# Patient Record
Sex: Female | Born: 1975 | Race: White | Hispanic: No | Marital: Married | Smoking: Never smoker
Health system: Southern US, Community
[De-identification: ages and names within clinical notes are randomized; demographics above are authoritative.]

## PROBLEM LIST (undated history)

## (undated) DIAGNOSIS — I10 Essential (primary) hypertension: Secondary | ICD-10-CM

## (undated) DIAGNOSIS — K219 Gastro-esophageal reflux disease without esophagitis: Secondary | ICD-10-CM

## (undated) DIAGNOSIS — R51 Headache: Secondary | ICD-10-CM

## (undated) HISTORY — PX: BACK SURGERY: SHX140

## (undated) HISTORY — PX: WISDOM TOOTH EXTRACTION: SHX21

## (undated) HISTORY — PX: DILATION AND CURETTAGE OF UTERUS: SHX78

---

## 2003-06-16 ENCOUNTER — Inpatient Hospital Stay (HOSPITAL_COMMUNITY): Admission: AD | Admit: 2003-06-16 | Discharge: 2003-06-16 | Payer: Self-pay | Admitting: Obstetrics and Gynecology

## 2003-06-18 ENCOUNTER — Inpatient Hospital Stay (HOSPITAL_COMMUNITY): Admission: AD | Admit: 2003-06-18 | Discharge: 2003-06-18 | Payer: Self-pay | Admitting: Obstetrics and Gynecology

## 2003-06-22 ENCOUNTER — Inpatient Hospital Stay (HOSPITAL_COMMUNITY): Admission: AD | Admit: 2003-06-22 | Discharge: 2003-06-25 | Payer: Self-pay | Admitting: Obstetrics & Gynecology

## 2003-06-26 ENCOUNTER — Encounter: Admission: RE | Admit: 2003-06-26 | Discharge: 2003-07-26 | Payer: Self-pay | Admitting: Obstetrics and Gynecology

## 2004-06-05 ENCOUNTER — Other Ambulatory Visit: Admission: RE | Admit: 2004-06-05 | Discharge: 2004-06-05 | Payer: Self-pay | Admitting: Obstetrics and Gynecology

## 2004-06-24 ENCOUNTER — Ambulatory Visit (HOSPITAL_COMMUNITY): Admission: AD | Admit: 2004-06-24 | Discharge: 2004-06-24 | Payer: Self-pay | Admitting: Obstetrics & Gynecology

## 2004-06-24 ENCOUNTER — Encounter (INDEPENDENT_AMBULATORY_CARE_PROVIDER_SITE_OTHER): Payer: Self-pay | Admitting: *Deleted

## 2004-12-25 ENCOUNTER — Ambulatory Visit (HOSPITAL_COMMUNITY): Admission: RE | Admit: 2004-12-25 | Discharge: 2004-12-25 | Payer: Self-pay | Admitting: Obstetrics & Gynecology

## 2004-12-26 ENCOUNTER — Ambulatory Visit (HOSPITAL_COMMUNITY): Admission: RE | Admit: 2004-12-26 | Discharge: 2004-12-26 | Payer: Self-pay | Admitting: Obstetrics & Gynecology

## 2004-12-26 ENCOUNTER — Encounter (INDEPENDENT_AMBULATORY_CARE_PROVIDER_SITE_OTHER): Payer: Self-pay | Admitting: Specialist

## 2007-11-08 ENCOUNTER — Inpatient Hospital Stay (HOSPITAL_COMMUNITY): Admission: AD | Admit: 2007-11-08 | Discharge: 2007-11-08 | Payer: Self-pay | Admitting: Obstetrics and Gynecology

## 2007-11-09 ENCOUNTER — Inpatient Hospital Stay (HOSPITAL_COMMUNITY): Admission: RE | Admit: 2007-11-09 | Discharge: 2007-11-11 | Payer: Self-pay | Admitting: Obstetrics & Gynecology

## 2008-11-10 HISTORY — PX: NECK SURGERY: SHX720

## 2010-07-03 ENCOUNTER — Encounter: Admission: RE | Admit: 2010-07-03 | Discharge: 2010-07-03 | Payer: Self-pay | Admitting: Orthopedic Surgery

## 2010-12-01 ENCOUNTER — Encounter: Payer: Self-pay | Admitting: Orthopedic Surgery

## 2011-03-28 NOTE — Op Note (Signed)
Jenna Ayala, Jenna Ayala                           ACCOUNT NO.:  [id number]   MEDICAL RECORD NO.:  [id number]                   PATIENT TYPE:  AMB   LOCATION:  [facility]                                 FACILITY:  [facility]   PHYSICIAN:  Ilda Mori, M.D.                DATE OF BIRTH:  [birth year]   DATE OF PROCEDURE:  06/24/2004  DATE OF DISCHARGE:                                 OPERATIVE REPORT   PREOPERATIVE DIAGNOSES:  Incomplete abortion.   POSTOPERATIVE DIAGNOSES:  Incomplete abortion.   PROCEDURE:  Dilatation and evacuation.   SURGEON:  Ilda Mori, M.D.   ANESTHESIA:  Paracervical block with IV sedation.   ESTIMATED BLOOD LOSS:  30 mL.   FINDINGS:  The cervix was dilated to greater than 1 cm. The uterus was 10-12  week size, products of conception were obtained consistent with a 10-12 week  missed abortion.   INDICATIONS FOR PROCEDURE:  This is a 35 year old, gravida 3, para 2 at [redacted]  weeks gestation who presented to the office today with vaginal bleeding.  Ultrasound evaluation revealed a nine week fetal pull with no fetal heart  tones.  In addition, the cervix was dilated and the patient was experiencing  moderate bleeding.  The findings were discussed with the patient.  Options  were also discussed and it was strongly recommended that she proceed with  dilatation evacuation before she began to hemorrhage with a full blown  incomplete abortion.  The patient agreed and the surgery was scheduled.   DESCRIPTION OF PROCEDURE:  The patient was brought to the operating room and  she was placed in the dorsal lithotomy position.  IV sedation was  administered. The perineum and vagina were prepped and draped in a sterile  fashion. The cervix was grasped with a single tooth tenaculum, 20 mL of 1%  lidocaine was infiltrated in the paracervical tissues.  The #10 suction  curette was then introduced without dilatation into the endometrial cavity.  Suction was applied and the products of  conception were evacuated. The  procedure was continued until the uterine cavity was felt to be empty.  The  procedure was then terminated and the patient left the operating room in  good condition.                                               Ilda Mori, M.D.    RK/MEDQ  D:  06/24/2004  T:  06/24/2004  Job:  [id number]

## 2011-03-28 NOTE — Op Note (Signed)
NAMEMAILA, DUKES               ACCOUNT NO.:  [id number]   MEDICAL RECORD NO.:  [id number]          PATIENT TYPE:  OUT   LOCATION:  [facility]                           FACILITY:  [facility]   PHYSICIAN:  Gerrit Friends. Aldona Bar, M.D.   DATE OF BIRTH:  06/17/1976   DATE OF PROCEDURE:  12/25/2004  DATE OF DISCHARGE:                                 OPERATIVE REPORT   PREOPERATIVE DIAGNOSES:  1.  Second trimester pregnancy loss.  2.  Blood type A positive.   POSTOPERATIVE DIAGNOSES:  1.  Second trimester pregnancy loss, pathology pending.  2.  Blood type A positive.   PROCEDURE:  Second trimester dilatation and evacuation.   SURGEON:  Gerrit Friends. Aldona Bar, M.D.   ANESTHESIA:  Intravenous conscious sedation plus paracervical block with 1%  Xylocaine without epinephrine.   HISTORY:  This 35 year old gravida 4, para 2, abortus 1 was seen in the  office yesterday for a routine antenatal visit and was supposed to be 14  weeks' gestation.  No fetal heart was heard, no fetal heart was seen on  ultrasound, and confirmation was carried out at [facility] with a  second ultrasound with again no fetal heart seen.  Diagnosis of second  trimester pregnancy loss was made and the patient was counseled accordingly.  She is now taken to the operating room for evacuation of a second trimester  pregnancy loss by suction, dilatation and evacuation.   PROCEDURE:  The patient was taken to the operating room, where after  satisfactory induction of intravenous conscious sedation, she was placed in  a modified lithotomy position in the short Allen stirrups.  She was prepped  and draped in usual fashion and the bladder was drained of clear urine with  a red rubber catheter in an in-and-out fashion.  At this time, a speculum  was placed in the vagina, cervix well-visualized and a single-tooth  tenaculum placed on the anterior lip of the cervix for uterine stability  during the procedure.  A paracervical block at this time  was carried out  using 20 mL of 1% Xylocaine without epinephrine.  Thereafter, the internal  os was dilated to a #43 Pratt dilator without difficulty and then using a  #14 suction curette, the cavity was thoroughly gently and systematically  evacuated of products of conception.  Good uterine contractility was  afforded with slowly given intravenous Pitocin.  Using a large standard  curette, the cavity was then curetted with no further products produced.  Using a #10 suction curette, the cavity was again suctioned and no further  production of products of conception was produced.  The cavity was again  curetted and noted to be clean.  The uterus was contracting well and blood  loss at this point was minimal.   At this time, procedure was felt to be complete and all instruments were  removed.  Examination was carried out with the findings consistent with a  uterus that was approximately 8-10 weeks' size and firm.  Blood loss was  minimal at this point, although blood loss during the procedure probably  approximated 100  mL.   All products of conception were sent to pathology.  Blood type was noted to  be A-positive.   The patient will be discharged to home with appropriate instructions.  Medications will include doxycycline 100 mg twice daily for a total of 5  days, Methergine 0.2 mg p.o. every 4-6 hours as needed for increased  bleeding and Motrin 600 mg every 6 hours as needed for cramping.  She will  return to the office for followup in approximately 2 weeks' time or as  needed.   CONDITION ON ARRIVAL TO RECOVERY ROOM:  Satisfactory.      RMW/MEDQ  D:  12/26/2004  T:  12/26/2004  Job:  [id number]

## 2011-08-15 LAB — CBC
HCT: 25.7 — ABNORMAL LOW
Hemoglobin: 11.3 — ABNORMAL LOW
MCHC: 33.5
MCV: 77.3 — ABNORMAL LOW
MCV: 78
Platelets: 240
Platelets: 270
RBC: 4.28
RDW: 15.4
WBC: 9.1
WBC: 9.1

## 2011-08-15 LAB — COMPREHENSIVE METABOLIC PANEL
ALT: 14
AST: 15
AST: 18
Albumin: 2.7 — ABNORMAL LOW
CO2: 20
Calcium: 9
Chloride: 105
Creatinine, Ser: 0.7
GFR calc Af Amer: >60
GFR calc Af Amer: >60
GFR calc non Af Amer: >60
GFR calc non Af Amer: >60
Sodium: 132 — ABNORMAL LOW
Total Bilirubin: 0.6

## 2011-08-15 LAB — URIC ACID: Uric Acid, Serum: 4.7

## 2012-07-27 ENCOUNTER — Other Ambulatory Visit: Payer: Self-pay | Admitting: Obstetrics & Gynecology

## 2012-10-04 ENCOUNTER — Other Ambulatory Visit: Payer: Self-pay | Admitting: Obstetrics and Gynecology

## 2012-10-09 ENCOUNTER — Encounter (HOSPITAL_COMMUNITY): Payer: Self-pay | Admitting: Pharmacist

## 2012-10-18 ENCOUNTER — Encounter (HOSPITAL_COMMUNITY): Payer: Self-pay | Admitting: *Deleted

## 2012-10-20 MED ORDER — DEXTROSE 5 % IV SOLN
3.0000 g | INTRAVENOUS | Status: DC
  Filled 2012-10-20: qty 3000

## 2012-10-21 ENCOUNTER — Ambulatory Visit (HOSPITAL_COMMUNITY): Payer: PRIVATE HEALTH INSURANCE | Admitting: Anesthesiology

## 2012-10-21 ENCOUNTER — Encounter (HOSPITAL_COMMUNITY): Payer: Self-pay | Admitting: *Deleted

## 2012-10-21 ENCOUNTER — Encounter (HOSPITAL_COMMUNITY): Payer: Self-pay | Admitting: Anesthesiology

## 2012-10-21 ENCOUNTER — Ambulatory Visit (HOSPITAL_COMMUNITY)
Admission: RE | Admit: 2012-10-21 | Discharge: 2012-10-21 | Disposition: A | Discharge status: Home / Self Care | Payer: PRIVATE HEALTH INSURANCE | Source: Ambulatory Visit | Attending: Obstetrics and Gynecology | Admitting: Obstetrics and Gynecology

## 2012-10-21 ENCOUNTER — Encounter (HOSPITAL_COMMUNITY)
Admission: RE | Disposition: A | Discharge status: Home / Self Care | Payer: Self-pay | Source: Ambulatory Visit | Attending: Obstetrics and Gynecology

## 2012-10-21 DIAGNOSIS — N920 Excessive and frequent menstruation with regular cycle: Secondary | ICD-10-CM

## 2012-10-21 DIAGNOSIS — N840 Polyp of corpus uteri: Secondary | ICD-10-CM | POA: Insufficient documentation

## 2012-10-21 HISTORY — PX: HYSTEROSCOPY WITH D & C: SHX1775

## 2012-10-21 HISTORY — DX: Headache: R51

## 2012-10-21 LAB — CBC
MCH: 25.2 pg — ABNORMAL LOW (ref 26.0–34.0)
MCV: 80.1 fL (ref 78.0–100.0)
Platelets: 273 10*3/uL (ref 150–400)
RDW: 14.9 % (ref 11.5–15.5)
WBC: 7.3 10*3/uL (ref 4.0–10.5)

## 2012-10-21 SURGERY — DILATATION AND CURETTAGE /HYSTEROSCOPY
Anesthesia: General | Site: Uterus | Wound class: Clean Contaminated

## 2012-10-21 MED ORDER — GLYCINE 1.5 % IR SOLN
Status: DC | PRN
  Administered 2012-10-21: 3000 mL

## 2012-10-21 MED ORDER — PROPOFOL 10 MG/ML IV EMUL
INTRAVENOUS | Status: AC
  Filled 2012-10-21: qty 20

## 2012-10-21 MED ORDER — KETOROLAC TROMETHAMINE 30 MG/ML IJ SOLN
INTRAMUSCULAR | Status: AC
  Filled 2012-10-21: qty 1

## 2012-10-21 MED ORDER — FENTANYL CITRATE 0.05 MG/ML IJ SOLN
INTRAMUSCULAR | Status: AC
  Filled 2012-10-21: qty 5

## 2012-10-21 MED ORDER — KETOROLAC TROMETHAMINE 30 MG/ML IJ SOLN: 15.0000 mg | Freq: Once | INTRAMUSCULAR | Status: DC | PRN

## 2012-10-21 MED ORDER — MIDAZOLAM HCL 5 MG/5ML IJ SOLN
INTRAMUSCULAR | Status: DC | PRN
  Administered 2012-10-21: 2 mg via INTRAVENOUS

## 2012-10-21 MED ORDER — LIDOCAINE HCL (CARDIAC) 20 MG/ML IV SOLN
INTRAVENOUS | Status: DC | PRN
  Administered 2012-10-21: 80 mg via INTRAVENOUS

## 2012-10-21 MED ORDER — PROPOFOL 10 MG/ML IV EMUL
INTRAVENOUS | Status: DC | PRN
  Administered 2012-10-21: 200 mg via INTRAVENOUS

## 2012-10-21 MED ORDER — FENTANYL CITRATE 0.05 MG/ML IJ SOLN: 25.0000 ug | INTRAMUSCULAR | Status: DC | PRN

## 2012-10-21 MED ORDER — LIDOCAINE HCL 1 % IJ SOLN
INTRAMUSCULAR | Status: DC | PRN
  Administered 2012-10-21: 20 mL

## 2012-10-21 MED ORDER — LIDOCAINE HCL (CARDIAC) 20 MG/ML IV SOLN
INTRAVENOUS | Status: AC
  Filled 2012-10-21: qty 5

## 2012-10-21 MED ORDER — MIDAZOLAM HCL 2 MG/2ML IJ SOLN
INTRAMUSCULAR | Status: AC
  Filled 2012-10-21: qty 2

## 2012-10-21 MED ORDER — ONDANSETRON HCL 4 MG/2ML IJ SOLN
INTRAMUSCULAR | Status: AC
  Filled 2012-10-21: qty 2

## 2012-10-21 MED ORDER — FENTANYL CITRATE 0.05 MG/ML IJ SOLN
INTRAMUSCULAR | Status: DC | PRN
  Administered 2012-10-21: 100 ug via INTRAVENOUS

## 2012-10-21 MED ORDER — ONDANSETRON HCL 4 MG/2ML IJ SOLN
INTRAMUSCULAR | Status: DC | PRN
  Administered 2012-10-21: 4 mg via INTRAVENOUS

## 2012-10-21 MED ORDER — LACTATED RINGERS IV SOLN
INTRAVENOUS | Status: DC
  Administered 2012-10-21 (×2): via INTRAVENOUS

## 2012-10-21 MED ORDER — KETOROLAC TROMETHAMINE 30 MG/ML IJ SOLN
INTRAMUSCULAR | Status: DC | PRN
  Administered 2012-10-21: 30 mg via INTRAVENOUS
  Administered 2012-10-21: 30 mg via INTRAMUSCULAR

## 2012-10-21 MED ORDER — CLINDAMYCIN PHOSPHATE 300 MG/2ML IJ SOLN
900.0000 mg | Freq: Once | INTRAMUSCULAR | Status: AC
  Administered 2012-10-21: 900 mg via INTRAVENOUS
  Filled 2012-10-21: qty 6

## 2012-10-21 SURGICAL SUPPLY — 15 items
CANISTER SUCTION 2500CC (MISCELLANEOUS) ×2 IMPLANT
CATH ROBINSON RED A/P 16FR (CATHETERS) ×2 IMPLANT
CLOTH BEACON ORANGE TIMEOUT ST (SAFETY) ×2 IMPLANT
CONTAINER PREFILL 10% NBF 60ML (FORM) ×4 IMPLANT
DRESSING TELFA 8X3 (GAUZE/BANDAGES/DRESSINGS) ×2 IMPLANT
ELECT REM PT RETURN 9FT ADLT (ELECTROSURGICAL) ×2
ELECTRODE REM PT RTRN 9FT ADLT (ELECTROSURGICAL) ×1 IMPLANT
GLOVE ECLIPSE 7.0 STRL STRAW (GLOVE) ×4 IMPLANT
GOWN PREVENTION PLUS XLARGE (GOWN DISPOSABLE) ×2 IMPLANT
GOWN STRL REIN XL XLG (GOWN DISPOSABLE) ×4 IMPLANT
LOOP ANGLED CUTTING 22FR (CUTTING LOOP) IMPLANT
PACK HYSTEROSCOPY LF (CUSTOM PROCEDURE TRAY) ×2 IMPLANT
PAD OB MATERNITY 4.3X12.25 (PERSONAL CARE ITEMS) ×2 IMPLANT
TOWEL OR 17X24 6PK STRL BLUE (TOWEL DISPOSABLE) ×4 IMPLANT
WATER STERILE IRR 1000ML POUR (IV SOLUTION) ×2 IMPLANT

## 2012-10-21 NOTE — Transfer of Care (Signed)
Immediate Anesthesia Transfer of Care Note  Patient: Jenna Ayala  Procedure(s) Performed: Procedure(s) (LRB) with comments: DILATATION AND CURETTAGE /HYSTEROSCOPY (N/A)  Patient Location: PACU  Anesthesia Type:General  Level of Consciousness: awake, alert  and oriented  Airway & Oxygen Therapy: Patient Spontanous Breathing and Patient connected to nasal cannula oxygen  Post-op Assessment: Report given to PACU RN and Post -op Vital signs reviewed and stable  Post vital signs: stable  Complications: No apparent anesthesia complications

## 2012-10-21 NOTE — H&P (Signed)
Pt is a 36 yr old white female, who presents for hysteroscopy, and removal of endometrial polyp. She has had an endometrial polyp which was benign. PE: Overwt white female in NAD        HEENT-wnl        Abd- soft, nt        Pelvic-wnl IMP/ Menorrhagia         Endometrial polyp PLAN/ hysteroscopy            Removal of polyp

## 2012-10-21 NOTE — Anesthesia Preprocedure Evaluation (Addendum)
Anesthesia Evaluation  Patient identified by MRN, date of birth, ID band Patient awake    Reviewed: Allergy & Precautions, H&P , NPO status , Patient's Chart, lab work & pertinent test results, reviewed documented beta blocker date and time   History of Anesthesia Complications Negative for: history of anesthetic complications  Airway Mallampati: II TM Distance: >3 FB Neck ROM: full    Dental  (+) Teeth Intact   Pulmonary neg pulmonary ROS,  breath sounds clear to auscultation  Pulmonary exam normal       Cardiovascular Exercise Tolerance: Good negative cardio ROS  Rhythm:regular Rate:Normal     Neuro/Psych  Headaches (migraines - 3x/month), negative psych ROS   GI/Hepatic negative GI ROS, Neg liver ROS,   Endo/Other  Morbid obesity  Renal/GU negative Renal ROS  Female GU complaint     Musculoskeletal   Abdominal   Peds  Hematology negative hematology ROS (+)   Anesthesia Other Findings   Reproductive/Obstetrics negative OB ROS                          Anesthesia Physical Anesthesia Plan  ASA: III  Anesthesia Plan: General LMA   Post-op Pain Management:    Induction:   Airway Management Planned:   Additional Equipment:   Intra-op Plan:   Post-operative Plan:   Informed Consent: I have reviewed the patients History and Physical, chart, labs and discussed the procedure including the risks, benefits and alternatives for the proposed anesthesia with the patient or authorized representative who has indicated his/her understanding and acceptance.   Dental Advisory Given  Plan Discussed with: CRNA and Surgeon  Anesthesia Plan Comments:         Anesthesia Quick Evaluation

## 2012-10-21 NOTE — Anesthesia Postprocedure Evaluation (Signed)
Anesthesia Post Note  Patient: Jenna Ayala  Procedure(s) Performed: Procedure(s) (LRB): DILATATION AND CURETTAGE /HYSTEROSCOPY (N/A)  Anesthesia type: General  Patient location: PACU  Post pain: Pain level controlled  Post assessment: Post-op Vital signs reviewed  Last Vitals:  Filed Vitals:   10/21/12 1215  BP: 123/72  Pulse: 68  Temp:   Resp: 16    Post vital signs: Reviewed  Level of consciousness: sedated  Complications: No apparent anesthesia complications

## 2012-10-22 ENCOUNTER — Encounter (HOSPITAL_COMMUNITY): Payer: Self-pay | Admitting: Obstetrics and Gynecology

## 2012-10-22 NOTE — Op Note (Signed)
Jenna Ayala, Jenna Ayala               ACCOUNT NO.:  [id number]  MEDICAL RECORD NO.:  [id number]  LOCATION:  [facility]                          FACILITY:  [facility]  PHYSICIAN:  Malva Limes, M.D.    DATE OF BIRTH:  28-Sep-1976  DATE OF PROCEDURE:  10/21/2012 DATE OF DISCHARGE:  10/21/2012                              OPERATIVE REPORT   PREOPERATIVE DIAGNOSES: 1. Menorrhagia. 2. Suspected endometrial polyp.  POSTOPERATIVE DIAGNOSES: 1. Menorrhagia. 2. Suspected endometrial polyp.  PROCEDURES: 1. Hysteroscopy. 2. Dilation and curettage.  SURGEON:  Malva Limes, M.D.  ANESTHESIA:  General and paracervical block.  DRAINS:  Red rubber catheter bladder.  SPECIMENS:  Endometrial and endocervical curettings sent to Pathology.  ANTIBIOTICS:  Cleocin 900 mg IV x1.  PROCEDURE:  The patient was taken to the operating room where she was placed in dorsal supine position.  A general anesthetic was administered without difficulty.  She was then placed in the dorsal lithotomy position.  She was prepped and draped in the usual fashion for this procedure.  An exam under anesthesia revealed a normal size and shape to her uterus.  There was no adnexal masses.  The patient did have first- degree prolapse and a cystocele and rectocele.  A single-tooth tenaculum was applied to the anterior cervical lip.  A 20 mL of 1% lidocaine was used for paracervical block.  The cervix was dilated to a 27-French. The hysteroscope was advanced through the endocervical canal.  On entering the uterine cavity, the patient had a small polyp on the anterior right midbody.  Both ostia were visualized.  There was no evidence of any fibroids.  At this point, the hysteroscope was removed and sharp curettage was performed.  The hysteroscope was placed back into the uterine cavity.  It appeared that the polyp was removed and adequate curettage was completed.  This concluded the procedure.  She was taken to the recovery room in  stable condition. Instrument and lap count were correct x2.  She will be discharged to home.  She will be sent home with Advil to take p.r.n.  She will follow up in the office in 4 weeks.          ______________________________ Malva Limes, M.D.     MA/MEDQ  D:  10/21/2012  T:  10/22/2012  Job:  [id number]

## 2013-08-22 ENCOUNTER — Other Ambulatory Visit: Payer: Self-pay | Admitting: *Deleted

## 2013-08-22 DIAGNOSIS — R209 Unspecified disturbances of skin sensation: Secondary | ICD-10-CM

## 2013-08-22 DIAGNOSIS — M545 Low back pain: Secondary | ICD-10-CM

## 2013-08-27 ENCOUNTER — Other Ambulatory Visit: Payer: PRIVATE HEALTH INSURANCE

## 2013-08-28 ENCOUNTER — Other Ambulatory Visit: Payer: PRIVATE HEALTH INSURANCE

## 2013-09-07 ENCOUNTER — Ambulatory Visit
Admission: RE | Admit: 2013-09-07 | Discharge: 2013-09-07 | Disposition: A | Discharge status: Home / Self Care | Payer: PRIVATE HEALTH INSURANCE | Source: Ambulatory Visit | Attending: *Deleted | Admitting: *Deleted

## 2013-09-07 DIAGNOSIS — M545 Low back pain: Secondary | ICD-10-CM

## 2013-09-07 DIAGNOSIS — R209 Unspecified disturbances of skin sensation: Secondary | ICD-10-CM

## 2013-09-11 ENCOUNTER — Other Ambulatory Visit: Payer: PRIVATE HEALTH INSURANCE

## 2020-07-25 ENCOUNTER — Ambulatory Visit: Payer: PRIVATE HEALTH INSURANCE | Admitting: Family Medicine

## 2020-07-30 ENCOUNTER — Ambulatory Visit (INDEPENDENT_AMBULATORY_CARE_PROVIDER_SITE_OTHER): Payer: Managed Care, Other (non HMO) | Admitting: Family Medicine

## 2020-07-30 ENCOUNTER — Encounter: Payer: Self-pay | Admitting: Family Medicine

## 2020-07-30 ENCOUNTER — Other Ambulatory Visit: Payer: Self-pay

## 2020-07-30 VITALS — BP 130/88 | HR 87 | Temp 98.3°F | Ht 67.0 in | Wt 307.0 lb

## 2020-07-30 DIAGNOSIS — Z0000 Encounter for general adult medical examination without abnormal findings: Secondary | ICD-10-CM

## 2020-07-30 DIAGNOSIS — Z8739 Personal history of other diseases of the musculoskeletal system and connective tissue: Secondary | ICD-10-CM | POA: Insufficient documentation

## 2020-07-30 DIAGNOSIS — Z9889 Other specified postprocedural states: Secondary | ICD-10-CM | POA: Insufficient documentation

## 2020-07-30 DIAGNOSIS — E559 Vitamin D deficiency, unspecified: Secondary | ICD-10-CM | POA: Diagnosis not present

## 2020-07-30 DIAGNOSIS — Z1159 Encounter for screening for other viral diseases: Secondary | ICD-10-CM | POA: Diagnosis not present

## 2020-07-30 DIAGNOSIS — Z114 Encounter for screening for human immunodeficiency virus [HIV]: Secondary | ICD-10-CM

## 2020-07-30 NOTE — Patient Instructions (Addendum)
-look for tetanus shot. Need every 10 years.  -colon cancer screening at age 45 years. Let me know if you would like to do colonoscopy or cologuard (send off kit that is good for 3 years. Checks DNA of stool for risk of cancer)  -need mammogram. Highly recommend.   Preventive Care 91-74 Years Old, Female Preventive care refers to visits with your health care provider and lifestyle choices that can promote health and wellness. This includes:  A yearly physical exam. This may also be called an annual well check.  Regular dental visits and eye exams.  Immunizations.  Screening for certain conditions.  Healthy lifestyle choices, such as eating a healthy diet, getting regular exercise, not using drugs or products that contain nicotine and tobacco, and limiting alcohol use. What can I expect for my preventive care visit? Physical exam Your health care provider will check your:  Height and weight. This may be used to calculate body mass index (BMI), which tells if you are at a healthy weight.  Heart rate and blood pressure.  Skin for abnormal spots. Counseling Your health care provider may ask you questions about your:  Alcohol, tobacco, and drug use.  Emotional well-being.  Home and relationship well-being.  Sexual activity.  Eating habits.  Work and work Statistician.  Method of birth control.  Menstrual cycle.  Pregnancy history. What immunizations do I need?  Influenza (flu) vaccine  This is recommended every year. Tetanus, diphtheria, and pertussis (Tdap) vaccine  You may need a Td booster every 10 years. Varicella (chickenpox) vaccine  You may need this if you have not been vaccinated. Zoster (shingles) vaccine  You may need this after age 40. Measles, mumps, and rubella (MMR) vaccine  You may need at least one dose of MMR if you were born in 1957 or later. You may also need a second dose. Pneumococcal conjugate (PCV13) vaccine  You may need this if you  have certain conditions and were not previously vaccinated. Pneumococcal polysaccharide (PPSV23) vaccine  You may need one or two doses if you smoke cigarettes or if you have certain conditions. Meningococcal conjugate (MenACWY) vaccine  You may need this if you have certain conditions. Hepatitis A vaccine  You may need this if you have certain conditions or if you travel or work in places where you may be exposed to hepatitis A. Hepatitis B vaccine  You may need this if you have certain conditions or if you travel or work in places where you may be exposed to hepatitis B. Haemophilus influenzae type b (Hib) vaccine  You may need this if you have certain conditions. Human papillomavirus (HPV) vaccine  If recommended by your health care provider, you may need three doses over 6 months. You may receive vaccines as individual doses or as more than one vaccine together in one shot (combination vaccines). Talk with your health care provider about the risks and benefits of combination vaccines. What tests do I need? Blood tests  Lipid and cholesterol levels. These may be checked every 5 years, or more frequently if you are over 59 years old.  Hepatitis C test.  Hepatitis B test. Screening  Lung cancer screening. You may have this screening every year starting at age 71 if you have a 30-pack-year history of smoking and currently smoke or have quit within the past 15 years.  Colorectal cancer screening. All adults should have this screening starting at age 36 and continuing until age 50. Your health care provider may recommend  screening at age 15 if you are at increased risk. You will have tests every 1-10 years, depending on your results and the type of screening test.  Diabetes screening. This is done by checking your blood sugar (glucose) after you have not eaten for a while (fasting). You may have this done every 1-3 years.  Mammogram. This may be done every 1-2 years. Talk with your  health care provider about when you should start having regular mammograms. This may depend on whether you have a family history of breast cancer.  BRCA-related cancer screening. This may be done if you have a family history of breast, ovarian, tubal, or peritoneal cancers.  Pelvic exam and Pap test. This may be done every 3 years starting at age 81. Starting at age 64, this may be done every 5 years if you have a Pap test in combination with an HPV test. Other tests  Sexually transmitted disease (STD) testing.  Bone density scan. This is done to screen for osteoporosis. You may have this scan if you are at high risk for osteoporosis. Follow these instructions at home: Eating and drinking  Eat a diet that includes fresh fruits and vegetables, whole grains, lean protein, and low-fat dairy.  Take vitamin and mineral supplements as recommended by your health care provider.  Do not drink alcohol if: ? Your health care provider tells you not to drink. ? You are pregnant, may be pregnant, or are planning to become pregnant.  If you drink alcohol: ? Limit how much you have to 0-1 drink a day. ? Be aware of how much alcohol is in your drink. In the [place] one drink equals one 12 oz bottle of beer (355 mL), one 5 oz glass of wine (148 mL), or one 1 oz glass of hard liquor (44 mL). Lifestyle  Take daily care of your teeth and gums.  Stay active. Exercise for at least 30 minutes on 5 or more days each week.  Do not use any products that contain nicotine or tobacco, such as cigarettes, e-cigarettes, and chewing tobacco. If you need help quitting, ask your health care provider.  If you are sexually active, practice safe sex. Use a condom or other form of birth control (contraception) in order to prevent pregnancy and STIs (sexually transmitted infections).  If told by your health care provider, take low-dose aspirin daily starting at age 42. What's next?  Visit your health care provider once  a year for a well check visit.  Ask your health care provider how often you should have your eyes and teeth checked.  Stay up to date on all vaccines. This information is not intended to replace advice given to you by your health care provider. Make sure you discuss any questions you have with your health care provider. Document Revised: 07/08/2018 Document Reviewed: 07/08/2018 Elsevier Patient Education  2020 [employer].  See below for covid prophylaxis.   1) if you test positive, I need to know right away so I can set you up with infusion. We have here at [facility] I just have to call and get you set up with them.    Also while covid is rampant you can take vitamins for prevention then if you get covid, I increase the vitamins and start ivermectin and possibly other steroid/inhaled steroids if needed. Below is prevention protocol.   1) vitamin D3 2000IU/day 2) vitamin C: 500-1040m twice a day 3) quercetin: 501mdaily 4) melatonin 73m74mefore bedtime (may cause drowsiness)  5) zinc 30-21m/day  Gargle mouthwash like scope/act/crest twice a day.   Call me right away if you get covid/set up telehealth appointment so we can discuss treatment.

## 2020-07-30 NOTE — Progress Notes (Signed)
Patient: Jenna Ayala MRN: [id number] DOB: 10-31-1976 PCP: Orma Flaming, MD     Subjective:  Chief Complaint  Patient presents with   Establish Care    HPI: The patient is a 44 y.o. female who presents today for annual exam. She denies any changes to past medical history. There have been no recent hospitalizations. They are not following a well balanced diet and exercise plan. Weight has been stable. No complaints today.   No family history of breast or colon cancer.   There is no immunization history on file for this patient.  Colonoscopy: age 89 years.  Mammogram: never had this done.  Pap smear: 2018-normal followed at Dr. Stann Mainland   Review of Systems  Constitutional: Negative for chills, fatigue and fever.  HENT: Negative for dental problem, ear pain, hearing loss and trouble swallowing.   Eyes: Negative for visual disturbance.  Respiratory: Negative for cough, chest tightness and shortness of breath.   Cardiovascular: Negative for chest pain, palpitations and leg swelling.  Gastrointestinal: Negative for abdominal pain, blood in stool, diarrhea and nausea.  Endocrine: Negative for cold intolerance, polydipsia, polyphagia and polyuria.  Genitourinary: Negative for dysuria and hematuria.  Musculoskeletal: Positive for arthralgias and back pain.  Skin: Negative for rash.  Neurological: Negative for dizziness and headaches.  Psychiatric/Behavioral: Negative for dysphoric mood and sleep disturbance. The patient is not nervous/anxious.     Allergies Patient is allergic to penicillins.  Past Medical History Patient  has a past medical history of Headache(784.0) and SVD (spontaneous vaginal delivery).  Surgical History Patient  has a past surgical history that includes Neck surgery (2010); Dilation and curettage of uterus; Wisdom tooth extraction; and Hysteroscopy with D & C (10/21/2012).  Family History Pateint's family history is not on file.  Social History Patient   reports that she has never smoked. She has never used smokeless tobacco. She reports that she does not drink alcohol and does not use drugs.    Objective: Vitals:   07/30/20 1427  BP: 130/88  Pulse: 87  Temp: 98.3 F (36.8 C)  SpO2: 98%  Weight: (!) 307 lb (139.3 kg)  Height: 5' 7"  (1.702 m)    Body mass index is 48.08 kg/m.  Physical Exam Vitals reviewed.  Constitutional:      Appearance: Normal appearance. She is well-developed. She is obese.  HENT:     Head: Normocephalic and atraumatic.     Right Ear: Tympanic membrane, ear canal and external ear normal.     Left Ear: Tympanic membrane, ear canal and external ear normal.     Nose: Nose normal.     Mouth/Throat:     Mouth: Mucous membranes are moist.  Eyes:     Conjunctiva/sclera: Conjunctivae normal.     Pupils: Pupils are equal, round, and reactive to light.  Neck:     Thyroid: No thyromegaly.  Cardiovascular:     Rate and Rhythm: Normal rate and regular rhythm.     Pulses: Normal pulses.     Heart sounds: Normal heart sounds. No murmur heard.   Pulmonary:     Effort: Pulmonary effort is normal.     Breath sounds: Normal breath sounds.  Abdominal:     General: Bowel sounds are normal. There is no distension.     Palpations: Abdomen is soft.     Tenderness: There is no abdominal tenderness.  Musculoskeletal:     Cervical back: Normal range of motion and neck supple.  Lymphadenopathy:     Cervical:  No cervical adenopathy.  Skin:    General: Skin is warm and dry.     Capillary Refill: Capillary refill takes less than 2 seconds.     Findings: No rash.  Neurological:     General: No focal deficit present.     Mental Status: She is alert and oriented to person, place, and time.     Cranial Nerves: No cranial nerve deficit.     Coordination: Coordination normal.     Deep Tendon Reflexes: Reflexes normal.  Psychiatric:        Mood and Affect: Mood normal.        Behavior: Behavior normal.      Office Visit  from 07/30/2020 in [facility] [place]  PHQ-2 Total Score 0         Assessment/plan: 1. Annual physical exam Routine labs today. HM reviewed. Asked if she could look for her tdap. Pap smear utd with gyn. Encouraged weight loss and exercise. She knows what she needs to do. Has lost 80 pounds in the past. Will see her back in one year or as needed.  Patient counseling [x]    Nutrition: Stressed importance of moderation in sodium/caffeine intake, saturated fat and cholesterol, caloric balance, sufficient intake of fresh fruits, vegetables, fiber, calcium, iron, and 1 mg of folate supplement per day (for females capable of pregnancy).  [x]    Stressed the importance of regular exercise.   []    Substance Abuse: Discussed cessation/primary prevention of tobacco, alcohol, or other drug use; driving or other dangerous activities under the influence; availability of treatment for abuse.   [x]    Injury prevention: Discussed safety belts, safety helmets, smoke detector, smoking near bedding or upholstery.   [x]    Sexuality: Discussed sexually transmitted diseases, partner selection, use of condoms, avoidance of unintended pregnancy  and contraceptive alternatives.  [x]    Dental health: Discussed importance of regular tooth brushing, flossing, and dental visits.  [x]    Health maintenance and immunizations reviewed. Please refer to Health maintenance section.    - CBC with Differential/Platelet; Future - Lipid panel; Future - TSH; Future - COMPLETE METABOLIC PANEL WITH GFR; Future - CBC with Differential/Platelet - Lipid panel - TSH - COMPLETE METABOLIC PANEL WITH GFR  2. Vitamin D deficiency  - VITAMIN D 25 Hydroxy (Vit-D Deficiency, Fractures); Future - VITAMIN D 25 Hydroxy (Vit-D Deficiency, Fractures)  3. Encounter for screening for HIV  - HIV Antibody (routine testing w rflx); Future - HIV Antibody (routine testing w rflx)  4. Encounter for hepatitis C screening test for  low risk patient  - Hepatitis C antibody; Future - Hepatitis C antibody     This visit occurred during the SARS-CoV-2 public health emergency.  Safety protocols were in place, including screening questions prior to the visit, additional usage of staff PPE, and extensive cleaning of exam room while observing appropriate contact time as indicated for disinfecting solutions.     Return in about 1 year (around 07/30/2021) for annual .     Orma Flaming, MD [place]  07/30/2020

## 2020-07-31 LAB — LIPID PANEL
Cholesterol: 214 mg/dL — ABNORMAL HIGH (ref <200)
HDL: 50 mg/dL (ref >=50)
LDL Cholesterol (Calc): 141 mg/dL (calc) — ABNORMAL HIGH
Non-HDL Cholesterol (Calc): 164 mg/dL (calc) — ABNORMAL HIGH (ref <130)
Total CHOL/HDL Ratio: 4.3 (calc) (ref <5.0)
Triglycerides: 110 mg/dL (ref <150)

## 2020-07-31 LAB — COMPLETE METABOLIC PANEL WITH GFR
AG Ratio: 1.2 (calc) (ref 1.0–2.5)
ALT: 17 U/L (ref 6–29)
AST: 15 U/L (ref 10–35)
Albumin: 4.3 g/dL (ref 3.6–5.1)
Alkaline phosphatase (APISO): 75 U/L (ref 31–125)
BUN: 10 mg/dL (ref 7–25)
CO2: 23 mmol/L (ref 20–32)
Calcium: 9.9 mg/dL (ref 8.6–10.2)
Chloride: 105 mmol/L (ref 98–110)
Creat: 0.75 mg/dL (ref 0.50–1.10)
GFR, Est African American: 112 mL/min/{1.73_m2} (ref >=60)
GFR, Est Non African American: 96 mL/min/{1.73_m2} (ref >=60)
Globulin: 3.6 g/dL (calc) (ref 1.9–3.7)
Glucose, Bld: 107 mg/dL — ABNORMAL HIGH (ref 65–99)
Potassium: 4.1 mmol/L (ref 3.5–5.3)
Sodium: 137 mmol/L (ref 135–146)
Total Bilirubin: 0.4 mg/dL (ref 0.2–1.2)
Total Protein: 7.9 g/dL (ref 6.1–8.1)

## 2020-07-31 LAB — CBC WITH DIFFERENTIAL/PLATELET
Absolute Monocytes: 489 cells/uL (ref 200–950)
Basophils Absolute: 56 cells/uL (ref 0–200)
Basophils Relative: 0.6 %
Eosinophils Absolute: 179 cells/uL (ref 15–500)
Eosinophils Relative: 1.9 %
HCT: 40.2 % (ref 35.0–45.0)
Hemoglobin: 12.8 g/dL (ref 11.7–15.5)
Lymphs Abs: 1598 cells/uL (ref 850–3900)
MCH: 26.3 pg — ABNORMAL LOW (ref 27.0–33.0)
MCHC: 31.8 g/dL — ABNORMAL LOW (ref 32.0–36.0)
MCV: 82.5 fL (ref 80.0–100.0)
MPV: 9.9 fL (ref 7.5–12.5)
Monocytes Relative: 5.2 %
Neutro Abs: 7078 cells/uL (ref 1500–7800)
Neutrophils Relative %: 75.3 %
Platelets: 328 10*3/uL (ref 140–400)
RBC: 4.87 10*6/uL (ref 3.80–5.10)
RDW: 14.6 % (ref 11.0–15.0)
Total Lymphocyte: 17 %
WBC: 9.4 10*3/uL (ref 3.8–10.8)

## 2020-07-31 LAB — TSH: TSH: 2.06 mIU/L

## 2020-07-31 LAB — HIV ANTIBODY (ROUTINE TESTING W REFLEX): HIV 1&2 Ab, 4th Generation: NONREACTIVE

## 2020-07-31 LAB — HEPATITIS C ANTIBODY
Hepatitis C Ab: NONREACTIVE
SIGNAL TO CUT-OFF: 0.02 (ref <1.00)

## 2020-07-31 LAB — VITAMIN D 25 HYDROXY (VIT D DEFICIENCY, FRACTURES): Vit D, 25-Hydroxy: 17 ng/mL — ABNORMAL LOW (ref 30–100)

## 2020-08-01 ENCOUNTER — Other Ambulatory Visit: Payer: Self-pay | Admitting: Family Medicine

## 2020-08-01 DIAGNOSIS — E559 Vitamin D deficiency, unspecified: Secondary | ICD-10-CM | POA: Insufficient documentation

## 2020-08-01 DIAGNOSIS — E782 Mixed hyperlipidemia: Secondary | ICD-10-CM | POA: Insufficient documentation

## 2020-08-01 MED ORDER — VITAMIN D (ERGOCALCIFEROL) 1.25 MG (50000 UNIT) PO CAPS: ORAL_CAPSULE | ORAL | 0 refills | Status: DC

## 2020-08-09 ENCOUNTER — Telehealth: Payer: Self-pay

## 2020-08-09 NOTE — Telephone Encounter (Signed)
I was able to speak with the pt to recommend Covid Vaccine resources through [facility] and CDC website.

## 2020-08-09 NOTE — Telephone Encounter (Signed)
Pt would like a callback in regards to questions about the covid vaccination.

## 2020-08-14 ENCOUNTER — Telehealth: Payer: Self-pay

## 2020-08-14 NOTE — Telephone Encounter (Signed)
Pt called back with more questions about the vaccine. Pt states she would rather speak with Dr. Rogers Blocker about these questions. They are related to allergic reaction information. Please advise.

## 2020-08-14 NOTE — Telephone Encounter (Signed)
Will pt need virtual to discuss/address questions with you?

## 2020-08-16 NOTE — Telephone Encounter (Signed)
Melitta, Let her know I will call her tomorrow!  Orma Flaming, MD [place]  e

## 2020-08-16 NOTE — Telephone Encounter (Signed)
Called pt to give message below. Pt's concerns are Angioedema and would like to know if she needs to have the vaccine?

## 2020-08-20 NOTE — Telephone Encounter (Signed)
Called and answered all questions regarding vaccines concerns. She is setting up appointment.  Dr. Rogers Blocker

## 2021-03-11 ENCOUNTER — Other Ambulatory Visit: Payer: Self-pay | Admitting: Family Medicine

## 2021-03-11 MED ORDER — ACYCLOVIR 400 MG PO TABS: 400.0000 mg | ORAL_TABLET | Freq: Three times a day (TID) | ORAL | 2 refills | Status: AC

## 2021-08-01 ENCOUNTER — Encounter: Payer: Managed Care, Other (non HMO) | Admitting: Family Medicine

## 2021-08-05 ENCOUNTER — Encounter: Payer: Managed Care, Other (non HMO) | Admitting: Family Medicine

## 2021-10-07 ENCOUNTER — Encounter: Payer: Self-pay | Admitting: Family Medicine

## 2021-10-07 ENCOUNTER — Other Ambulatory Visit: Payer: Self-pay

## 2021-10-07 ENCOUNTER — Ambulatory Visit: Payer: Managed Care, Other (non HMO) | Admitting: Family Medicine

## 2021-10-07 VITALS — BP 126/82 | HR 79 | Temp 97.5°F | Ht 67.0 in | Wt 300.2 lb

## 2021-10-07 DIAGNOSIS — E559 Vitamin D deficiency, unspecified: Secondary | ICD-10-CM

## 2021-10-07 DIAGNOSIS — Z0000 Encounter for general adult medical examination without abnormal findings: Secondary | ICD-10-CM | POA: Diagnosis not present

## 2021-10-07 DIAGNOSIS — L309 Dermatitis, unspecified: Secondary | ICD-10-CM

## 2021-10-07 DIAGNOSIS — Z1212 Encounter for screening for malignant neoplasm of rectum: Secondary | ICD-10-CM

## 2021-10-07 DIAGNOSIS — Z1211 Encounter for screening for malignant neoplasm of colon: Secondary | ICD-10-CM

## 2021-10-07 DIAGNOSIS — Z1231 Encounter for screening mammogram for malignant neoplasm of breast: Secondary | ICD-10-CM | POA: Diagnosis not present

## 2021-10-07 DIAGNOSIS — J4520 Mild intermittent asthma, uncomplicated: Secondary | ICD-10-CM

## 2021-10-07 DIAGNOSIS — G8929 Other chronic pain: Secondary | ICD-10-CM

## 2021-10-07 DIAGNOSIS — G43009 Migraine without aura, not intractable, without status migrainosus: Secondary | ICD-10-CM

## 2021-10-07 DIAGNOSIS — M545 Low back pain, unspecified: Secondary | ICD-10-CM

## 2021-10-07 HISTORY — DX: Migraine without aura, not intractable, without status migrainosus: G43.009

## 2021-10-07 HISTORY — DX: Low back pain, unspecified: M54.50

## 2021-10-07 HISTORY — DX: Dermatitis, unspecified: L30.9

## 2021-10-07 HISTORY — DX: Mild intermittent asthma, uncomplicated: J45.20

## 2021-10-07 MED ORDER — TRIAMCINOLONE ACETONIDE 0.1 % EX CREA: 1.0000 "application " | TOPICAL_CREAM | Freq: Two times a day (BID) | CUTANEOUS | 1 refills | Status: AC

## 2021-10-07 NOTE — Progress Notes (Signed)
Subjective  Chief Complaint  Patient presents with   Transitions Of Care   Annual Exam    Not fasting    HPI: Jenna Ayala is a 45 y.o. female who presents to [place] at [place] today for a Female Wellness Visit. She also has the concerns and/or needs as listed above in the chief complaint. These will be addressed in addition to the Health Maintenance Visit.   Wellness Visit: annual visit with health maintenance review and exam without Pap  HM: pt overdue for pap: sees Dr. Stann Mainland. Overdue for mammogram. Eligible for CRC screen; avg risk. Declines imms at this time: flu and shingrix Chronic disease f/u and/or acute problem visit: (deemed necessary to be done in addition to the wellness visit): Married 45 year old female with 3 children ages ranging 14-26.  All living in the home.  She works full-time from home.  Overall healthy but we did discuss each chronic medical problems as outlined below.  I updated her problem list, family history and social history. She is past history of migraine headaches.  She has been treated with triptans in the past but has failed.  Uses ibuprofen.  Gets headache only intermittently.  No red flag symptoms.  Has not been followed by neurology. Chronic low back pain: I reviewed notes from her past neurosurgical encounters.  She has had 2 spinal surgeries: Lumbar and cervical.  Still has intermittent flares of lumbago.  She uses intermittent muscle relaxers NSAIDs and her prior PCP gave her hydrocodone to use.  She rarely uses this.  Her last prescription was written over a year ago.  She does not follow with neurosurgery now.  Rare neck symptoms. Has chronically dry skin but worse in the winter and also has some eczematous patches on her dorsal hands.  She uses triamcinolone cream as needed.  She requests refill. Obesity: struggles life long.  Assessment  1. Annual physical exam   2. Vitamin D deficiency   3. Morbid obesity ([place])   4.  Encounter for screening mammogram for breast cancer   5. Screening for colorectal cancer   6. Migraine without aura and without status migrainosus, not intractable   7. Mild intermittent asthma without complication   8. Chronic midline low back pain without sciatica   9. Eczema, unspecified type      Plan  Female Wellness Visit: Age appropriate Health Maintenance and Prevention measures were discussed with patient. Included topics are cancer screening recommendations, ways to keep healthy (see AVS) including dietary and exercise recommendations, regular eye and dental care, use of seat belts, and avoidance of moderate alcohol use and tobacco use. Crc options discussed; cologuard ordered. Mammo ordered. Pt to scheduel with gyn for female wellness and pap BMI: discussed patient's BMI and encouraged positive lifestyle modifications to help get to or maintain a target BMI. HM needs and immunizations were addressed and ordered. See below for orders. See HM and immunization section for updates. Declines.  Routine labs and screening tests ordered including cmp, cbc and lipids where appropriate. Discussed recommendations regarding Vit D and calcium supplementation (see AVS)  Chronic disease management visit and/or acute problem visit: Migraines: advil prn Eczema: TAC cream.  Low back pain: will monitor. Mm relaxer and rare norco discussed.  Ashtma: MI, albuterol rarely.  Recheck vit D and labs.   Follow up: Return in about 6 months (around 04/06/2022) for blood pressure recheck.  Orders Placed This Encounter  Procedures   MM DIGITAL SCREENING BILATERAL  CBC with Differential/Platelet   Comprehensive metabolic panel   Lipid panel   VITAMIN D 25 Hydroxy (Vit-D Deficiency, Fractures)   Hemoglobin A1c   TSH   Cologuard   Meds ordered this encounter  Medications   triamcinolone cream (KENALOG) 0.1 %    Sig: Apply 1 application topically 2 (two) times daily. For 2 weeks, then as needed     Dispense:  80 g    Refill:  1       Body mass index is 47.02 kg/m. Wt Readings from Last 3 Encounters:  10/07/21 (!) 300 lb 3.2 oz (136.2 kg)  07/30/20 (!) 307 lb (139.3 kg)  10/18/12 268 lb (121.6 kg)     Patient Active Problem List   Diagnosis Date Noted   Migraine without aura and without status migrainosus, not intractable 10/07/2021    Ibuprofen; has failed triptans    Asthma, mild intermittent 10/07/2021   Lumbago 10/07/2021    Intermittent flares    Eczema 10/07/2021    Hand, TAC cream prn    Vitamin D deficiency 08/01/2020    repleted 07/2020    S/P discectomy for herniated nucleus pulposus 07/30/2020    2014. Hemilaminectomy and discectomy L5-S1 at [facility] neurosurgery     Health Maintenance  Topic Date Due   TETANUS/TDAP  Never done   PAP SMEAR-Modifier  01/09/2020   COLONOSCOPY (Pts 45-105yr Insurance coverage will need to be confirmed)  Never done   INFLUENZA VACCINE  02/07/2022 (Originally 06/10/2021)   Hepatitis C Screening  Completed   HIV Screening  Completed   Pneumococcal Vaccine [id number] old  Aged Out   HPV VACCINES  Aged Out   COVID-19 Vaccine  Discontinued    There is no immunization history on file for this patient. We updated and reviewed the patient's past history in detail and it is documented below. Allergies: Patient is allergic to penicillins. Past Medical History Patient  has a past medical history of Asthma, mild intermittent (10/07/2021), Eczema (10/07/2021), Headache(784.0), Lumbago (10/07/2021), Migraine without aura and without status migrainosus, not intractable (10/07/2021), and SVD (spontaneous vaginal delivery). Past Surgical History Patient  has a past surgical history that includes Neck surgery (2010); Dilation and curettage of uterus; Wisdom tooth extraction; and Hysteroscopy with D & C (10/21/2012). Family History: Patient family history is not on file. Social History:  Patient  reports that she has never smoked. She has  never used smokeless tobacco. She reports that she does not drink alcohol and does not use drugs.  Review of Systems: Constitutional: negative for fever or malaise Ophthalmic: negative for photophobia, double vision or loss of vision Cardiovascular: negative for chest pain, dyspnea on exertion, or new LE swelling Respiratory: negative for SOB or persistent cough Gastrointestinal: negative for abdominal pain, change in bowel habits or melena Genitourinary: negative for dysuria or gross hematuria, no abnormal uterine bleeding or disharge Musculoskeletal: negative for new gait disturbance or muscular weakness Integumentary: negative for new or persistent rashes, no breast lumps Neurological: negative for TIA or stroke symptoms Psychiatric: negative for SI or delusions Allergic/Immunologic: negative for hives  Patient Care Team    Relationship Specialty Notifications Start End  ALeamon Arnt MD PCP - General Family Medicine  10/07/21   WVania Rea MD Consulting Physician Obstetrics and Gynecology  07/30/20     Objective  Vitals: BP 126/82   Pulse 79   Temp (!) 97.5 F (36.4 C) (Temporal)   Ht 5' 7"  (1.702 m)   Wt (!) 300  lb 3.2 oz (136.2 kg)   LMP 09/20/2021 (Approximate)   SpO2 100%   BMI 47.02 kg/m  General:  Well developed, well nourished, no acute distress  Psych:  Alert and orientedx3,normal mood and affect HEENT:  Normocephalic, atraumatic, non-icteric sclera,  supple neck without adenopathy, mass or thyromegaly Cardiovascular:  Normal S1, S2, RRR without gallop, rub or murmur Respiratory:  Good breath sounds bilaterally, CTAB with normal respiratory effort Gastrointestinal: normal bowel sounds, soft, non-tender, no noted masses. No HSM MSK: no deformities, contusions. Joints are without erythema or swelling.  Skin:  Warm, no rashes or suspicious lesions noted Neurologic:    Mental status is normal. CN 2-11 are normal. Gross motor and sensory exams are normal. Normal  gait. No tremor   Commons side effects, risks, benefits, and alternatives for medications and treatment plan prescribed today were discussed, and the patient expressed understanding of the given instructions. Patient is instructed to call or message via MyChart if he/she has any questions or concerns regarding our treatment plan. No barriers to understanding were identified. We discussed Red Flag symptoms and signs in detail. Patient expressed understanding regarding what to do in case of urgent or emergency type symptoms.  Medication list was reconciled, printed and provided to the patient in AVS. Patient instructions and summary information was reviewed with the patient as documented in the AVS. This note was prepared with assistance of Dragon voice recognition software. Occasional wrong-word or sound-a-like substitutions may have occurred due to the inherent limitations of voice recognition software  This visit occurred during the SARS-CoV-2 public health emergency.  Safety protocols were in place, including screening questions prior to the visit, additional usage of staff PPE, and extensive cleaning of exam room while observing appropriate contact time as indicated for disinfecting solutions.

## 2021-10-07 NOTE — Patient Instructions (Signed)
Please return in 6 months for a blood pressure recheck.   Please schedule with GYN or me for your overdue pap smear.   I recommend the Cologuard test for your colon cancer screening that is due. I have ordered this test for you. [facility] will soon contact you to verify your insurance, address etc. They will then send you the kit; follow the instructions in the kit and return the kit to [facility]. They will run the test and send the results to me. I will then give you the results. If this test is negative, we recommend repeating a colon cancer screening test in 3 years. If it is positive, I will refer you to a Gastroenterologist so you can get set up for the recommended colonoscopy.  Thank you!   I will release your lab results to you on your MyChart account with further instructions. Please reply with any questions.    I have ordered a mammogram and/or bone density for you as we discussed today: _0   Mammogram  _1   Bone Density  Please call the office checked below to schedule your appointment:  _2   [facility]      [place], [place]         _3   [facility]  [address] [place], [place]  It was a pleasure meeting you today! Thank you for choosing [place] to meet your healthcare needs! I truly look forward to working with you. If you have any questions or concerns, please send me a message via Mychart or call the office at [phone].

## 2021-10-08 LAB — CBC WITH DIFFERENTIAL/PLATELET
Basophils Absolute: 0.1 10*3/uL (ref 0.0–0.1)
Basophils Relative: 1.3 % (ref 0.0–3.0)
Eosinophils Absolute: 0.2 10*3/uL (ref 0.0–0.7)
Eosinophils Relative: 2.9 % (ref 0.0–5.0)
HCT: 37.4 % (ref 36.0–46.0)
Hemoglobin: 12.4 g/dL (ref 12.0–15.0)
Lymphocytes Relative: 21.7 % (ref 12.0–46.0)
Lymphs Abs: 1.7 10*3/uL (ref 0.7–4.0)
MCHC: 33.1 g/dL (ref 30.0–36.0)
MCV: 87.8 fl (ref 78.0–100.0)
Monocytes Absolute: 0.4 10*3/uL (ref 0.1–1.0)
Monocytes Relative: 5.6 % (ref 3.0–12.0)
Neutro Abs: 5.4 10*3/uL (ref 1.4–7.7)
Neutrophils Relative %: 68.5 % (ref 43.0–77.0)
Platelets: 338 10*3/uL (ref 150.0–400.0)
RBC: 4.26 Mil/uL (ref 3.87–5.11)
RDW: 14.4 % (ref 11.5–15.5)
WBC: 7.9 10*3/uL (ref 4.0–10.5)

## 2021-10-08 LAB — VITAMIN D 25 HYDROXY (VIT D DEFICIENCY, FRACTURES): VITD: 12.38 ng/mL — ABNORMAL LOW (ref 30.00–100.00)

## 2021-10-08 LAB — LIPID PANEL
Cholesterol: 206 mg/dL — ABNORMAL HIGH (ref 0–200)
HDL: 46.5 mg/dL (ref >39.00)
LDL Cholesterol: 126 mg/dL — ABNORMAL HIGH (ref 0–99)
NonHDL: 159.8
Total CHOL/HDL Ratio: 4
Triglycerides: 170 mg/dL — ABNORMAL HIGH (ref 0.0–149.0)
VLDL: 34 mg/dL (ref 0.0–40.0)

## 2021-10-08 LAB — HEMOGLOBIN A1C: Hgb A1c MFr Bld: 5.4 % (ref 4.6–6.5)

## 2021-10-08 LAB — COMPREHENSIVE METABOLIC PANEL
ALT: 18 U/L (ref 0–35)
AST: 16 U/L (ref 0–37)
Albumin: 4.2 g/dL (ref 3.5–5.2)
Alkaline Phosphatase: 53 U/L (ref 39–117)
BUN: 11 mg/dL (ref 6–23)
CO2: 24 mEq/L (ref 19–32)
Calcium: 9.4 mg/dL (ref 8.4–10.5)
Chloride: 105 mEq/L (ref 96–112)
Creatinine, Ser: 0.8 mg/dL (ref 0.40–1.20)
GFR: 88.32 mL/min (ref >60.00)
Glucose, Bld: 93 mg/dL (ref 70–99)
Potassium: 4 mEq/L (ref 3.5–5.1)
Sodium: 137 mEq/L (ref 135–145)
Total Bilirubin: 0.3 mg/dL (ref 0.2–1.2)
Total Protein: 7.4 g/dL (ref 6.0–8.3)

## 2021-10-08 LAB — TSH: TSH: 1.96 u[IU]/mL (ref 0.35–5.50)

## 2021-10-09 ENCOUNTER — Other Ambulatory Visit: Payer: Self-pay

## 2021-10-09 MED ORDER — VITAMIN D (ERGOCALCIFEROL) 1.25 MG (50000 UNIT) PO CAPS: 50000.0000 [IU] | ORAL_CAPSULE | ORAL | 0 refills | Status: DC

## 2021-10-09 MED ORDER — VITAMIN D (ERGOCALCIFEROL) 1.25 MG (50000 UNIT) PO CAPS: 50000.0000 [IU] | ORAL_CAPSULE | ORAL | 0 refills | Status: AC

## 2021-10-09 NOTE — Addendum Note (Signed)
Addended by: Billey Chang on: 10/09/2021 09:24 AM   Modules accepted: Orders

## 2021-10-31 ENCOUNTER — Other Ambulatory Visit: Payer: Self-pay

## 2021-10-31 DIAGNOSIS — Z1211 Encounter for screening for malignant neoplasm of colon: Secondary | ICD-10-CM

## 2021-11-29 LAB — COLOGUARD: COLOGUARD: NEGATIVE

## 2021-12-13 ENCOUNTER — Telehealth: Payer: Self-pay | Admitting: Family Medicine

## 2021-12-13 NOTE — Telephone Encounter (Signed)
This was sent in error to wrong chart.

## 2022-03-07 ENCOUNTER — Ambulatory Visit: Payer: Managed Care, Other (non HMO) | Admitting: Family Medicine

## 2022-03-07 ENCOUNTER — Encounter: Payer: Self-pay | Admitting: Family Medicine

## 2022-03-07 VITALS — BP 126/80 | HR 76 | Temp 98.2°F | Ht 67.0 in | Wt 296.8 lb

## 2022-03-07 DIAGNOSIS — R112 Nausea with vomiting, unspecified: Secondary | ICD-10-CM

## 2022-03-07 DIAGNOSIS — R1310 Dysphagia, unspecified: Secondary | ICD-10-CM

## 2022-03-07 DIAGNOSIS — R1013 Epigastric pain: Secondary | ICD-10-CM | POA: Diagnosis not present

## 2022-03-07 DIAGNOSIS — R1011 Right upper quadrant pain: Secondary | ICD-10-CM | POA: Diagnosis not present

## 2022-03-07 LAB — CBC WITH DIFFERENTIAL/PLATELET
Basophils Absolute: 0.1 10*3/uL (ref 0.0–0.1)
Basophils Relative: 0.7 % (ref 0.0–3.0)
Eosinophils Absolute: 0.2 10*3/uL (ref 0.0–0.7)
Eosinophils Relative: 2.8 % (ref 0.0–5.0)
HCT: 37.4 % (ref 36.0–46.0)
Hemoglobin: 12.1 g/dL (ref 12.0–15.0)
Lymphocytes Relative: 21.1 % (ref 12.0–46.0)
Lymphs Abs: 1.5 10*3/uL (ref 0.7–4.0)
MCHC: 32.3 g/dL (ref 30.0–36.0)
MCV: 81.5 fl (ref 78.0–100.0)
Monocytes Absolute: 0.5 10*3/uL (ref 0.1–1.0)
Monocytes Relative: 6.8 % (ref 3.0–12.0)
Neutro Abs: 4.8 10*3/uL (ref 1.4–7.7)
Neutrophils Relative %: 68.6 % (ref 43.0–77.0)
Platelets: 280 10*3/uL (ref 150.0–400.0)
RBC: 4.59 Mil/uL (ref 3.87–5.11)
RDW: 16.6 % — ABNORMAL HIGH (ref 11.5–15.5)
WBC: 7.1 10*3/uL (ref 4.0–10.5)

## 2022-03-07 LAB — COMPREHENSIVE METABOLIC PANEL
ALT: 78 U/L — ABNORMAL HIGH (ref 0–35)
AST: 33 U/L (ref 0–37)
Albumin: 4.4 g/dL (ref 3.5–5.2)
Alkaline Phosphatase: 64 U/L (ref 39–117)
BUN: 14 mg/dL (ref 6–23)
CO2: 25 mEq/L (ref 19–32)
Calcium: 9.1 mg/dL (ref 8.4–10.5)
Chloride: 105 mEq/L (ref 96–112)
Creatinine, Ser: 0.83 mg/dL (ref 0.40–1.20)
GFR: 84.26 mL/min (ref >60.00)
Glucose, Bld: 105 mg/dL — ABNORMAL HIGH (ref 70–99)
Potassium: 3.8 mEq/L (ref 3.5–5.1)
Sodium: 137 mEq/L (ref 135–145)
Total Bilirubin: 0.5 mg/dL (ref 0.2–1.2)
Total Protein: 7.8 g/dL (ref 6.0–8.3)

## 2022-03-07 LAB — LIPASE: Lipase: 24 U/L (ref 11.0–59.0)

## 2022-03-07 MED ORDER — OMEPRAZOLE 20 MG PO CPDR: 20.0000 mg | DELAYED_RELEASE_CAPSULE | Freq: Every day | ORAL | 3 refills | Status: DC

## 2022-03-07 NOTE — Patient Instructions (Signed)
Please follow up as scheduled for your next visit with me: 04/09/2022  ? ?If you have any questions or concerns, please don't hesitate to send me a message via MyChart or call the office at [phone]. Thank you for visiting with [place] today! It's our pleasure caring for you.  ? ?Start the medication. We will call you to get you scheduled for an ultrasound of your liver and gallbladder.  ?Avoid fatty fried foods and spicy foods.  ? ?Esophagitis ? ?Esophagitis is inflammation of the esophagus. The esophagus is the tube that carries food from the mouth to the stomach. Esophagitis can cause soreness or pain in the esophagus. This condition can make it difficult and painful to swallow. ?What are the causes? ?Most causes of esophagitis are not serious. Common causes of this condition include: ?Gastroesophageal reflux disease (GERD). This is when stomach contents move back up into the esophagus (reflux). ?Repeated vomiting. ?An allergic reaction, especially caused by food allergies (eosinophilic esophagitis). ?Injury to the esophagus by swallowing large pills with or without water, or swallowing certain types of medicines. ?Swallowing harmful chemicals, such as household cleaning products. ?Drinking a lot of alcohol. ?An infection of the esophagus. This most often occurs in people who have a weakened immune system. ?Radiation or chemotherapy treatment for cancer. ?Certain diseases such as sarcoidosis, Crohn's disease, and scleroderma. ?What are the signs or symptoms? ?Symptoms of this condition include: ?Difficult or painful swallowing. ?Pain with swallowing acidic liquids, such as citrus juices. You may also have pain when you burp. ?Chest pain and difficulty breathing. ?Nausea and vomiting. ?Pain in the abdomen. ?Weight loss. ?Ulcers in the mouth and white patches in the mouth (candidiasis). ?Fever. ?Coughing up blood or vomiting blood. ?Stool that is black, tarry, or bright red. ?How is this diagnosed? ?This condition  may be diagnosed based on your medical history and a physical exam. You may also have other tests, including: ?A test to examine your esophagus and stomach with a small flexible tube with a camera (endoscopy). ?A test that measures the acidity level in your esophagus. ?A test that measures how much pressure is on your esophagus. ?A barium swallow or modified barium swallow to show the shape, size, and functioning of your esophagus. ?Allergy tests. ?How is this treated? ?Treatment for this condition depends on the cause of your esophagitis. In some cases, steroids or other medicines may be given to help relieve your symptoms or to treat the underlying cause of your condition. You may have to make some lifestyle changes, such as: ?Avoiding alcohol. ?Quitting any products that contain nicotine or tobacco. These products include cigarettes, chewing tobacco, and vaping devices, such as e-cigarettes. If you need help quitting, ask your health care provider. ?Changing your diet. ?Exercising. ?Changing your sleep habits and your sleep environment. ?Follow these instructions at home: ?Medicines ?Take over-the-counter and prescription medicines only as told by your health care provider. ?Do not take aspirin, ibuprofen, or other NSAIDs unless your health care provider told you to do so. ?If you have trouble taking pills: ?Use a pill splitter to decrease the size of the pill. This will decrease the chance of the pill getting stuck or injuring your esophagus. ?Drink water after you take a pill. ?Eating and drinking ? ?Avoid foods and drinks that seem to make your symptoms worse. ?Follow a diet as recommended by your health care provider. This may involve avoiding foods and drinks such as: ?Coffee and tea, with or without caffeine. ?Drinks that contain alcohol. ?Energy drinks  and sports drinks. ?Carbonated drinks or sodas. ?Chocolate and cocoa. ?Peppermint and mint flavorings. ?Garlic and onions. ?Horseradish. ?Spicy and acidic  foods, including peppers, chili powder, curry powder, vinegar, hot sauces, and barbecue sauce. ?Citrus fruit juices and citrus fruits, such as oranges, lemons, and limes. ?Tomato-based foods, such as red sauce, chili, salsa, and pizza with red sauce. ?Fried and fatty foods, such as donuts, french fries, potato chips, and high-fat dressings. ?High-fat meats, such as hot dogs and fatty cuts of red and white meats, such as rib eye steak, sausage, ham, and bacon. ?High-fat dairy items, such as whole milk, butter, and cream cheese. ?Lifestyle ?Eat small, frequent meals instead of large meals. ?Avoid drinking large amounts of liquid with your meals. ?Avoid eating meals during the 2-3 hours before bedtime. ?Avoid lying down right after you eat. ?Do not exercise right after you eat. ?Do not use any products that contain nicotine or tobacco. These products include cigarettes, chewing tobacco, and vaping devices, such as e-cigarettes. If you need help quitting, ask your health care provider. ?General instructions ? ?Pay attention to any changes in your symptoms. Let your health care provider know about them. ?Wear loose-fitting clothing. Do not wear anything tight around your waist that causes pressure on your abdomen. ?Raise (elevate) the head of your bed about 6 inches (15 cm). You may need to use a wedge to do this. ?Try relaxation strategies such as yoga, deep breathing, or meditation to manage stress. If you need help reducing stress, ask your health care provider. ?If you are overweight, reduce your weight to an amount that is healthy for you. Ask your health care provider for guidance about a safe weight loss goal. ?Keep all follow-up visits. This is important. ?Contact a health care provider if: ?You have new symptoms. ?You have unexplained weight loss. ?You have difficulty swallowing, or it hurts to swallow. ?You have wheezing or a cough that does not go away. ?Your symptoms do not improve with treatment. ?You have  frequent heartburn for more than two weeks. ?Get help right away if: ?You have sudden severe pain in your arms, neck, jaw, teeth, or back. ?You suddenly feel sweaty, dizzy, or light-headed. ?You have chest pain or shortness of breath. ?You vomit and the vomit is green, yellow, or black, or it looks like blood or coffee grounds. ?Your stool is red, bloody, or black. ?You have a fever. ?You cannot swallow, drink, or eat. ?These symptoms may represent a serious problem that is an emergency. Do not wait to see if the symptoms will go away. Get medical help right away. Call your local emergency services (911 in the [place] Do not drive yourself to the hospital. ?Summary ?Esophagitis is inflammation of the esophagus. ?Most causes of esophagitis are not serious. ?Follow your health care provider's instructions about eating and drinking. ?Contact a health care provider if you have new symptoms, have weight loss, or coughing that does not stop. ?Get help right away if you have severe pain in the arms, neck, jaw, teeth, or back, or if you have chest pain, shortness of breath, or fever. ?This information is not intended to replace advice given to you by your health care provider. Make sure you discuss any questions you have with your health care provider. ?Document Revised: 05/07/2020 Document Reviewed: 05/07/2020 ?Elsevier Patient Education ? [place]. ? ?

## 2022-03-07 NOTE — Progress Notes (Signed)
? ?Subjective  ?CC:  ?Chief Complaint  ?Patient presents with  ? Follow-up  ?  Pt was seen in UC on 03/04/2022 for some pain in the abdominal are and her back  ? ? ?HPI: Jenna Ayala is a 46 y.o. female who presents to the office today to address the problems listed above in the chief complaint. ?46 year old female with morbid obesity presents to follow-up due to recurrent abdominal pain.  She reports that several months back she had an uncomfortable sensation while eating sausage balls.  Described as razor blade irritation in the esophagus that she was swallowing.  She felt some reflux symptoms.  Symptoms lasted for several hours.  She denies feeling of food getting stuck or heartburn symptoms.  Symptoms eventually passed.  This is happened several times over the last several months.  Once it was relieved with Tums.  However more recently, she had a severe episode of midepigastric pain described as squeezing pressure from the front to back, upper abdomen with the same esophagus irritation as she swallowed.  Neither Tums or antacid helped and she had nausea with vomiting without hematemesis.  She had no fevers or chills.  Symptoms subsided after several hours.  She was evaluated at an urgent care 3 days ago.  No lab work or scanning was done.  Since she is been trying to eat more bland foods.  She has had some reflux symptoms.  No lower GI symptoms.  Normal bowel movements.  No melena.  No history of GERD.  She is a non-smoker.  No chest pain or shortness of breath. ? ?Assessment  ?1. Midepigastric pain   ?2. Nausea and vomiting, unspecified vomiting type   ?3. Odynophagia   ?4. RUQ pain   ? ?  ?Plan  ?Midepigastric pain and associated symptoms as listed above: Differential includes esophagitis, gastritis, GERD, pancreatitis, cholelithiasis attacks.  Discussed in detail.  Elect to start omeprazole daily, bland food, check lab work and ultrasound right upper quadrant.  Patient has follow-up with me in 4 weeks.  If  symptoms are not improved, will initiate work-up of the esophagus with EGD and/or barium swallow.  Patient understands agrees.  Red flags and urgent symptoms discussed. ? ? ?Follow up: As scheduled ?04/09/2022 ? ?Orders Placed This Encounter  ?Procedures  ? US ABDOMEN LIMITED RUQ (LIVER/GB)  ? CBC with Differential/Platelet  ? Comprehensive metabolic panel  ? Lipase  ? ?Meds ordered this encounter  ?Medications  ? omeprazole (PRILOSEC) 20 MG capsule  ?  Sig: Take 1 capsule (20 mg total) by mouth daily.  ?  Dispense:  30 capsule  ?  Refill:  3  ? ?  ? ?I reviewed the patients updated PMH, FH, and SocHx.  ?  ?Patient Active Problem List  ? Diagnosis Date Noted  ? Migraine without aura and without status migrainosus, not intractable 10/07/2021  ? Asthma, mild intermittent 10/07/2021  ? Lumbago 10/07/2021  ? Eczema 10/07/2021  ? Vitamin D deficiency 08/01/2020  ? S/P discectomy for herniated nucleus pulposus 07/30/2020  ? ?Current Meds  ?Medication Sig  ? acyclovir (ZOVIRAX) 400 MG tablet Take 1 tablet (400 mg total) by mouth 3 (three) times daily. For 5 days for outbreaks  ? cyclobenzaprine (FLEXERIL) 10 MG tablet Take 1 tablet by mouth daily as needed.  ? diclofenac (VOLTAREN) 75 MG EC tablet Take 75 mg by mouth 2 (two) times daily as needed.  ? diphenhydrAMINE (BENADRYL) 25 mg capsule Take 1 capsule by mouth daily as needed.  ?  HYDROcodone-acetaminophen (NORCO/VICODIN) 5-325 MG tablet Take 1 tablet by mouth every 6 (six) hours as needed for moderate pain.  ? ibuprofen (ADVIL,MOTRIN) 200 MG tablet Take 800 mg by mouth every 8 (eight) hours as needed.  ? loratadine (CLARITIN) 10 MG tablet Take 10 mg by mouth daily as needed for allergies.  ? omeprazole (PRILOSEC) 20 MG capsule Take 1 capsule (20 mg total) by mouth daily.  ? triamcinolone cream (KENALOG) 0.1 % Apply 1 application topically 2 (two) times daily as needed.  ? triamcinolone cream (KENALOG) 0.1 % Apply 1 application topically 2 (two) times daily. For 2 weeks,  then as needed  ? ? ?Allergies: ?Patient is allergic to penicillins. ?Family History: ?Patient family history is not on file. ?Social History:  ?Patient  reports that she has never smoked. She has never used smokeless tobacco. She reports that she does not drink alcohol and does not use drugs. ? ?Review of Systems: ?Constitutional: Negative for fever malaise or anorexia ?Cardiovascular: negative for chest pain ?Respiratory: negative for SOB or persistent cough ?Gastrointestinal: negative for abdominal pain ? ?Objective  ?Vitals: BP 126/80   Pulse 76   Temp 98.2 ?F (36.8 ?C)   Ht 5' 7"  (1.702 m)   Wt 296 lb 12.8 oz (134.6 kg)   SpO2 97%   BMI 46.49 kg/m?  ?General: no acute distress , A&Ox3 ?HEENT: PEERL, conjunctiva normal, anicteric, neck is supple ?Cardiovascular:  RRR without murmur or gallop.  ?Respiratory:  Good breath sounds bilaterally, CTAB with normal respiratory effort ?Abdomen, soft, normal bowel sounds, mild epigastric tenderness without rebound or guarding or masses.  No hepatosplenomegaly.  No right upper quadrant tenderness.  No flank pain ?Skin:  Warm, no rashes ? ? ? ?Commons side effects, risks, benefits, and alternatives for medications and treatment plan prescribed today were discussed, and the patient expressed understanding of the given instructions. Patient is instructed to call or message via MyChart if he/she has any questions or concerns regarding our treatment plan. No barriers to understanding were identified. We discussed Red Flag symptoms and signs in detail. Patient expressed understanding regarding what to do in case of urgent or emergency type symptoms.  ?Medication list was reconciled, printed and provided to the patient in AVS. Patient instructions and summary information was reviewed with the patient as documented in the AVS. ?This note was prepared with assistance of Systems analyst. Occasional wrong-word or sound-a-like substitutions may have occurred due  to the inherent limitations of voice recognition software ? ?This visit occurred during the SARS-CoV-2 public health emergency.  Safety protocols were in place, including screening questions prior to the visit, additional usage of staff PPE, and extensive cleaning of exam room while observing appropriate contact time as indicated for disinfecting solutions.  ? ?

## 2022-03-10 ENCOUNTER — Ambulatory Visit
Admission: RE | Admit: 2022-03-10 | Discharge: 2022-03-10 | Disposition: A | Discharge status: Home / Self Care | Payer: Managed Care, Other (non HMO) | Source: Ambulatory Visit | Attending: Family Medicine | Admitting: Family Medicine

## 2022-03-10 DIAGNOSIS — Z0000 Encounter for general adult medical examination without abnormal findings: Secondary | ICD-10-CM

## 2022-03-10 DIAGNOSIS — Z1231 Encounter for screening mammogram for malignant neoplasm of breast: Secondary | ICD-10-CM

## 2022-03-11 ENCOUNTER — Ambulatory Visit
Admission: RE | Admit: 2022-03-11 | Discharge: 2022-03-11 | Disposition: A | Discharge status: Home / Self Care | Payer: Managed Care, Other (non HMO) | Source: Ambulatory Visit | Attending: Family Medicine | Admitting: Family Medicine

## 2022-03-11 DIAGNOSIS — R1013 Epigastric pain: Secondary | ICD-10-CM

## 2022-03-11 DIAGNOSIS — R1011 Right upper quadrant pain: Secondary | ICD-10-CM

## 2022-03-11 DIAGNOSIS — R112 Nausea with vomiting, unspecified: Secondary | ICD-10-CM

## 2022-03-11 DIAGNOSIS — R1310 Dysphagia, unspecified: Secondary | ICD-10-CM

## 2022-03-12 ENCOUNTER — Other Ambulatory Visit: Payer: Self-pay | Admitting: Family Medicine

## 2022-03-12 DIAGNOSIS — R928 Other abnormal and inconclusive findings on diagnostic imaging of breast: Secondary | ICD-10-CM

## 2022-03-13 ENCOUNTER — Other Ambulatory Visit: Payer: Self-pay

## 2022-03-13 DIAGNOSIS — K8020 Calculus of gallbladder without cholecystitis without obstruction: Secondary | ICD-10-CM

## 2022-03-20 ENCOUNTER — Ambulatory Visit
Admission: RE | Admit: 2022-03-20 | Discharge: 2022-03-20 | Disposition: A | Discharge status: Home / Self Care | Payer: Managed Care, Other (non HMO) | Source: Ambulatory Visit | Attending: Family Medicine | Admitting: Family Medicine

## 2022-03-20 DIAGNOSIS — R928 Other abnormal and inconclusive findings on diagnostic imaging of breast: Secondary | ICD-10-CM

## 2022-04-09 ENCOUNTER — Encounter: Payer: Self-pay | Admitting: Family Medicine

## 2022-04-09 ENCOUNTER — Ambulatory Visit: Payer: Managed Care, Other (non HMO) | Admitting: Family Medicine

## 2022-04-09 VITALS — BP 130/82 | HR 71 | Ht 67.0 in | Wt 294.4 lb

## 2022-04-09 DIAGNOSIS — K8020 Calculus of gallbladder without cholecystitis without obstruction: Secondary | ICD-10-CM

## 2022-04-09 DIAGNOSIS — I10 Essential (primary) hypertension: Secondary | ICD-10-CM

## 2022-04-09 DIAGNOSIS — M545 Low back pain, unspecified: Secondary | ICD-10-CM | POA: Diagnosis not present

## 2022-04-09 DIAGNOSIS — K8050 Calculus of bile duct without cholangitis or cholecystitis without obstruction: Secondary | ICD-10-CM

## 2022-04-09 DIAGNOSIS — G8929 Other chronic pain: Secondary | ICD-10-CM

## 2022-04-09 DIAGNOSIS — K760 Fatty (change of) liver, not elsewhere classified: Secondary | ICD-10-CM

## 2022-04-09 MED ORDER — DILTIAZEM HCL ER 180 MG PO TB24: 180.0000 mg | ORAL_TABLET | Freq: Every day | ORAL | 3 refills | Status: AC

## 2022-04-09 NOTE — Progress Notes (Signed)
Subjective  CC:  Chief Complaint  Patient presents with   Hypertension    Pt here for 40mo recheck of Bp. Pt is schedule for Pap 04/21/2022    HPI: Jenna YARBROis a 46y.o. female who presents to the office today to address the problems listed above in the chief complaint. Hypertension f/u: Here for blood pressure follow-up.  She restarted her diltiazem a month ago due to elevated blood pressure readings.  Since, blood pressures have normalized.  She feels well on the medications.  Needs refill.  No adverse effects.  No chest pain or shortness of breath Gallstones with biliary colic: Seeing GI tomorrow.  Still with intermittent pain.  Tenderness at the site.  Reviewed ultrasound findings. Fatty liver: Eating a better diet. Gastritis: Improved on omeprazole Back pain: chronic  Assessment  1. Essential hypertension   2. Biliary colic   3. Calculus of gallbladder without cholecystitis without obstruction   4. Chronic midline low back pain without sciatica   5. Morbid obesity ([place]   6. Fatty liver      Plan   Hypertension f/u: BP control is well controlled. Refilled meds Biliary colic gallstones: needs cholecystectomy Low fat diet.   Education regarding management of these chronic disease states was given. Management strategies discussed on successive visits include dietary and exercise recommendations, goals of achieving and maintaining IBW, and lifestyle modifications aiming for adequate sleep and minimizing stressors.   Follow up: 6 mo for cpe  No orders of the defined types were placed in this encounter.  Meds ordered this encounter  Medications   diltiazem (CARDIZEM LA) 180 MG 24 hr tablet    Sig: Take 1 tablet (180 mg total) by mouth daily.    Dispense:  90 tablet    Refill:  3      BP Readings from Last 3 Encounters:  04/09/22 130/82  03/07/22 126/80  10/07/21 126/82   Wt Readings from Last 3 Encounters:  04/09/22 294 lb 6.4 oz (133.5 kg)  03/07/22 296  lb 12.8 oz (134.6 kg)  10/07/21 (!) 300 lb 3.2 oz (136.2 kg)    Lab Results  Component Value Date   CHOL 206 (H) 10/07/2021   CHOL 214 (H) 07/30/2020   Lab Results  Component Value Date   HDL 46.50 10/07/2021   HDL 50 07/30/2020   Lab Results  Component Value Date   LDLCALC 126 (H) 10/07/2021   LDLCALC 141 (H) 07/30/2020   Lab Results  Component Value Date   TRIG 170.0 (H) 10/07/2021   TRIG 110 07/30/2020   Lab Results  Component Value Date   CHOLHDL 4 10/07/2021   CHOLHDL 4.3 07/30/2020   No results found for: LDLDIRECT Lab Results  Component Value Date   CREATININE 0.83 03/07/2022   BUN 14 03/07/2022   NA 137 03/07/2022   K 3.8 03/07/2022   CL 105 03/07/2022   CO2 25 03/07/2022    The 10-year ASCVD risk score (Arnett DK, et al., 2019) is: 1.7%   Values used to calculate the score:     Age: [age]years     Sex: Female     Is Non-Hispanic African American: No     Diabetic: No     Tobacco smoker: No     Systolic Blood Pressure: 1623mmHg     Is BP treated: Yes     HDL Cholesterol: 46.5 mg/dL     Total Cholesterol: 206 mg/dL  I reviewed the patients updated  PMH, FH, and SocHx.    Patient Active Problem List   Diagnosis Date Noted   Morbid obesity ([place]) 04/09/2022   Fatty liver 04/09/2022   Migraine without aura and without status migrainosus, not intractable 10/07/2021   Asthma, mild intermittent 10/07/2021   Lumbago 10/07/2021   Eczema 10/07/2021   Vitamin D deficiency 08/01/2020   S/P discectomy for herniated nucleus pulposus 07/30/2020    Allergies: Penicillins  Social History: Patient  reports that she has never smoked. She has never used smokeless tobacco. She reports that she does not drink alcohol and does not use drugs.  Current Meds  Medication Sig   acyclovir (ZOVIRAX) 400 MG tablet Take 1 tablet (400 mg total) by mouth 3 (three) times daily. For 5 days for outbreaks   cyclobenzaprine (FLEXERIL) 10 MG tablet Take 1 tablet by mouth daily as  needed.   diclofenac (VOLTAREN) 75 MG EC tablet Take 75 mg by mouth 2 (two) times daily as needed.   diltiazem (CARDIZEM LA) 180 MG 24 hr tablet Take 1 tablet (180 mg total) by mouth daily.   diphenhydrAMINE (BENADRYL) 25 mg capsule Take 1 capsule by mouth daily as needed.   HYDROcodone-acetaminophen (NORCO/VICODIN) 5-325 MG tablet Take 1 tablet by mouth every 6 (six) hours as needed for moderate pain.   ibuprofen (ADVIL,MOTRIN) 200 MG tablet Take 800 mg by mouth every 8 (eight) hours as needed.   loratadine (CLARITIN) 10 MG tablet Take 10 mg by mouth daily as needed for allergies.   omeprazole (PRILOSEC) 20 MG capsule Take 1 capsule (20 mg total) by mouth daily.   triamcinolone cream (KENALOG) 0.1 % Apply 1 application topically 2 (two) times daily as needed.   triamcinolone cream (KENALOG) 0.1 % Apply 1 application topically 2 (two) times daily. For 2 weeks, then as needed   [DISCONTINUED] diltiazem (CARDIZEM LA) 180 MG 24 hr tablet diltiazem ER 180 mg tablet,extended release 24 hr  Take 1 tablet every day by oral route.    Review of Systems: Cardiovascular: negative for chest pain, palpitations, leg swelling, orthopnea Respiratory: negative for SOB, wheezing or persistent cough Gastrointestinal: negative for abdominal pain Genitourinary: negative for dysuria or gross hematuria  Objective  Vitals: BP 130/82   Pulse 71   Ht 5' 7"  (1.702 m)   Wt 294 lb 6.4 oz (133.5 kg)   LMP 03/10/2022   SpO2 98%   BMI 46.11 kg/m  General: no acute distress  Psych:  Alert and oriented, normal mood and affect HEENT:  Normocephalic, atraumatic, supple neck  Cardiovascular:  RRR without murmur. no edema Respiratory:  Good breath sounds bilaterally, CTAB with normal respiratory effort Abdomen: soft, ttp ruq w/o rebound or guarding. Mild midepigastric ttp. No mass. Skin:  Warm, no rashes Neurologic:   Mental status is normal Commons side effects, risks, benefits, and alternatives for medications and  treatment plan prescribed today were discussed, and the patient expressed understanding of the given instructions. Patient is instructed to call or message via MyChart if he/she has any questions or concerns regarding our treatment plan. No barriers to understanding were identified. We discussed Red Flag symptoms and signs in detail. Patient expressed understanding regarding what to do in case of urgent or emergency type symptoms.  Medication list was reconciled, printed and provided to the patient in AVS. Patient instructions and summary information was reviewed with the patient as documented in the AVS. This note was prepared with assistance of Dragon voice recognition software. Occasional wrong-word or sound-a-like substitutions may  have occurred due to the inherent limitations of voice recognition software  This visit occurred during the SARS-CoV-2 public health emergency.  Safety protocols were in place, including screening questions prior to the visit, additional usage of staff PPE, and extensive cleaning of exam room while observing appropriate contact time as indicated for disinfecting solutions.

## 2022-04-09 NOTE — Patient Instructions (Signed)
Please return in 6 months for your annual complete physical; please come fasting.   If you have any questions or concerns, please don't hesitate to send me a message via MyChart or call the office at [phone]. Thank you for visiting with [place] today! It's our pleasure caring for you.

## 2022-04-10 ENCOUNTER — Ambulatory Visit: Payer: Self-pay | Admitting: Surgery

## 2022-04-10 NOTE — H&P (Signed)
History of Present Illness: Jenna Ayala is a 46 y.o. female who was referred to me for evaluation of gallstones. Several months ago she began having reflux symptoms after eating. She was started on a PPI by her PCP and has had improvement in those symptoms.  However about a month ago she had an episode of severe pressure in the epigastric area radiating to the back.  She has also had intermittent stabbing pain in the right upper quadrant.  She has had several more mild episodes of pressure since then which tend to be worse after eating.  Her PCP referred her for a RUQ ultrasound, which showed cholelithiasis without acute cholecystitis on 5/2.  Recent LFTs on 4/28 showed a mildly elevated ALT of 78 but were otherwise normal.   The patient has not had any prior abdominal surgeries. She also endorses pain in her legs and neck.     Review of Systems: A complete review of systems was obtained from the patient.  I have reviewed this information and discussed as appropriate with the patient.  See HPI as well for other ROS.       Medical History: Past Medical History Past Medical History: Diagnosis Date  Arthritis    Hypertension     WHEN PREGNANT      There is no problem list on file for this patient.     Past Surgical History Past Surgical History: Procedure Laterality Date  NECK FUSION   11/10/2009  back surgery      POLYPS REMOVAL          Allergies Allergies Allergen Reactions  Penicillins Unknown      Current Outpatient Medications on File Prior to Visit Medication Sig Dispense Refill  acyclovir (ZOVIRAX) 400 MG tablet Take by mouth      dilTIAZem (CARDIZEM LA) 180 mg 24 hr tablet Take by mouth      omeprazole (PRILOSEC) 20 MG DR capsule        cyclobenzaprine (FLEXERIL) 10 MG tablet Take 10 mg by mouth 3 (three) times daily as needed for Muscle spasms. (Patient not taking: Reported on 04/10/2022)      diphenhydrAMINE (BENADRYL) 25 mg capsule Take 25 mg by mouth nightly as  needed for Itching. (Patient not taking: Reported on 04/10/2022)      doxepin (SINEQUAN) 25 MG capsule Take 25 mg by mouth nightly as needed for Sleep. (Patient not taking: Reported on 04/10/2022)      fexofenadine (ALLEGRA) 180 MG tablet Take 180 mg by mouth daily. (Patient not taking: Reported on 04/10/2022)      naproxen (NAPROSYN) 500 MG tablet Take 500 mg by mouth 2 (two) times daily as needed. (Patient not taking: Reported on 04/10/2022)       No current facility-administered medications on file prior to visit.     Family History No family history on file.     Social History   Tobacco Use Smoking Status Never Smokeless Tobacco Never     Social History Social History    Socioeconomic History  Marital status: Married Tobacco Use  Smoking status: Never  Smokeless tobacco: Never Vaping Use  Vaping Use: Never used Substance and Sexual Activity  Alcohol use: No  Drug use: No      Objective:     Vitals:   04/10/22 1549 BP: 130/82 Pulse: 101 Temp: 36.7 C (98.1 F) SpO2: 98% Weight: (!) 133 kg (293 lb 3.2 oz) Height: 170.2 cm (5' 7" )   Body mass index is 45.92 kg/m.  Physical Exam Vitals reviewed.  Constitutional:      General: She is not in acute distress.    Appearance: Normal appearance.  HENT:     Head: Normocephalic and atraumatic.  Eyes:     General: No scleral icterus.    Conjunctiva/sclera: Conjunctivae normal.  Cardiovascular:     Rate and Rhythm: Normal rate and regular rhythm.     Heart sounds: No murmur heard. Pulmonary:     Effort: Pulmonary effort is normal. No respiratory distress.     Breath sounds: Normal breath sounds.  Abdominal:     General: There is no distension.     Palpations: Abdomen is soft.     Tenderness: There is no abdominal tenderness.  Musculoskeletal:        General: Normal range of motion.     Cervical back: Normal range of motion.  Skin:    General: Skin is warm and dry.     Coloration: Skin is not jaundiced.   Neurological:     General: No focal deficit present.     Mental Status: She is alert and oriented to person, place, and time.  Psychiatric:        Mood and Affect: Mood normal.        Behavior: Behavior normal.        Thought Content: Thought content normal.            Assessment and Plan: Diagnoses and all orders for this visit:   Calculus of gallbladder without cholecystitis without obstruction     This is a 46 year old female presenting with epigastric and right upper quadrant abdominal pain, worse after eating.  Her symptoms are consistent with biliary colic.  I personally reviewed her ultrasound, which shows a large stone within the neck of the gallbladder, but no signs of acute cholecystitis. Laparoscopic cholecystectomy was recommended. The details of this procedure were discussed with the patient, including the risks of bleeding, infection, bile leak, and <0.5% risk of common bile duct injury. I also discussed that some of the symptoms she brought up today, such as her neck and leg pain, are not related to her gallbladder and will not improve with surgery. The patient expressed understanding and agrees to proceed with surgery.  She will be contacted to schedule an elective surgery date.  Her husband has upcoming surgery scheduled for June 21, so her surgery may need to be delayed until after that.  I reviewed the postoperative activity restrictions and expected recovery time with her.  Michaelle Birks, [facility], Hepatobiliary and Pancreatic Surgery 04/10/22 5:04 PM

## 2022-04-10 NOTE — H&P (View-Only) (Signed)
History of Present Illness: Jenna Ayala is a 46 y.o. female who was referred to me for evaluation of gallstones. Several months ago she began having reflux symptoms after eating. She was started on a PPI by her PCP and has had improvement in those symptoms.  However about a month ago she had an episode of severe pressure in the epigastric area radiating to the back.  She has also had intermittent stabbing pain in the right upper quadrant.  She has had several more mild episodes of pressure since then which tend to be worse after eating.  Her PCP referred her for a RUQ ultrasound, which showed cholelithiasis without acute cholecystitis on 5/2.  Recent LFTs on 4/28 showed a mildly elevated ALT of 78 but were otherwise normal.   The patient has not had any prior abdominal surgeries. She also endorses pain in her legs and neck.     Review of Systems: A complete review of systems was obtained from the patient.  I have reviewed this information and discussed as appropriate with the patient.  See HPI as well for other ROS.       Medical History: Past Medical History Past Medical History: Diagnosis Date  Arthritis    Hypertension     WHEN PREGNANT      There is no problem list on file for this patient.     Past Surgical History Past Surgical History: Procedure Laterality Date  NECK FUSION   11/10/2009  back surgery      POLYPS REMOVAL          Allergies Allergies Allergen Reactions  Penicillins Unknown      Current Outpatient Medications on File Prior to Visit Medication Sig Dispense Refill  acyclovir (ZOVIRAX) 400 MG tablet Take by mouth      dilTIAZem (CARDIZEM LA) 180 mg 24 hr tablet Take by mouth      omeprazole (PRILOSEC) 20 MG DR capsule        cyclobenzaprine (FLEXERIL) 10 MG tablet Take 10 mg by mouth 3 (three) times daily as needed for Muscle spasms. (Patient not taking: Reported on 04/10/2022)      diphenhydrAMINE (BENADRYL) 25 mg capsule Take 25 mg by mouth nightly as  needed for Itching. (Patient not taking: Reported on 04/10/2022)      doxepin (SINEQUAN) 25 MG capsule Take 25 mg by mouth nightly as needed for Sleep. (Patient not taking: Reported on 04/10/2022)      fexofenadine (ALLEGRA) 180 MG tablet Take 180 mg by mouth daily. (Patient not taking: Reported on 04/10/2022)      naproxen (NAPROSYN) 500 MG tablet Take 500 mg by mouth 2 (two) times daily as needed. (Patient not taking: Reported on 04/10/2022)       No current facility-administered medications on file prior to visit.     Family History No family history on file.     Social History   Tobacco Use Smoking Status Never Smokeless Tobacco Never     Social History Social History    Socioeconomic History  Marital status: Married Tobacco Use  Smoking status: Never  Smokeless tobacco: Never Vaping Use  Vaping Use: Never used Substance and Sexual Activity  Alcohol use: No  Drug use: No      Objective:     Vitals:   04/10/22 1549 BP: 130/82 Pulse: 101 Temp: 36.7 C (98.1 F) SpO2: 98% Weight: (!) 133 kg (293 lb 3.2 oz) Height: 170.2 cm (5' 7" )   Body mass index is 45.92 kg/m.  Physical Exam Vitals reviewed.  Constitutional:      General: She is not in acute distress.    Appearance: Normal appearance.  HENT:     Head: Normocephalic and atraumatic.  Eyes:     General: No scleral icterus.    Conjunctiva/sclera: Conjunctivae normal.  Cardiovascular:     Rate and Rhythm: Normal rate and regular rhythm.     Heart sounds: No murmur heard. Pulmonary:     Effort: Pulmonary effort is normal. No respiratory distress.     Breath sounds: Normal breath sounds.  Abdominal:     General: There is no distension.     Palpations: Abdomen is soft.     Tenderness: There is no abdominal tenderness.  Musculoskeletal:        General: Normal range of motion.     Cervical back: Normal range of motion.  Skin:    General: Skin is warm and dry.     Coloration: Skin is not jaundiced.   Neurological:     General: No focal deficit present.     Mental Status: She is alert and oriented to person, place, and time.  Psychiatric:        Mood and Affect: Mood normal.        Behavior: Behavior normal.        Thought Content: Thought content normal.            Assessment and Plan: Diagnoses and all orders for this visit:   Calculus of gallbladder without cholecystitis without obstruction     This is a 46 year old female presenting with epigastric and right upper quadrant abdominal pain, worse after eating.  Her symptoms are consistent with biliary colic.  I personally reviewed her ultrasound, which shows a large stone within the neck of the gallbladder, but no signs of acute cholecystitis. Laparoscopic cholecystectomy was recommended. The details of this procedure were discussed with the patient, including the risks of bleeding, infection, bile leak, and <0.5% risk of common bile duct injury. I also discussed that some of the symptoms she brought up today, such as her neck and leg pain, are not related to her gallbladder and will not improve with surgery. The patient expressed understanding and agrees to proceed with surgery.  She will be contacted to schedule an elective surgery date.  Her husband has upcoming surgery scheduled for June 21, so her surgery may need to be delayed until after that.  I reviewed the postoperative activity restrictions and expected recovery time with her.  Michaelle Birks, [facility], Hepatobiliary and Pancreatic Surgery 04/10/22 5:04 PM

## 2022-04-14 ENCOUNTER — Other Ambulatory Visit: Payer: Self-pay

## 2022-04-14 ENCOUNTER — Encounter (HOSPITAL_COMMUNITY): Payer: Self-pay | Admitting: Surgery

## 2022-04-14 NOTE — Pre-Procedure Instructions (Signed)
[facility] [address], [place] [address] Phone: [phone] Fax: [phone]   PCP - Leamon Arnt, MD  EKD: DOS  ERAS Protcol - Clears until 0730  COVID TEST- N/A  Anesthesia review: N  Patient verbally denies any shortness of breath, fever, cough and chest pain during phone call   -------------  SDW INSTRUCTIONS given:  Your procedure is scheduled on 6/7/230.  Report to [facility] Main Entrance "A" at 0800 A.M., and check in at the Admitting office.  Call this number if you have problems the morning of surgery:  [phone]   Remember:  Do not eat after midnight the night before your surgery  You may drink clear liquids until 0730 the morning of your surgery.   Clear liquids allowed are: Water, Non-Citrus Juices (without pulp), Carbonated Beverages, Clear Tea, Black Coffee Only, and Gatorade    Take these medicines the morning of surgery with A SIP OF WATER  diltiazem (CARDIZEM LA)  omeprazole (PRILOSEC)  HYDROcodone-acetaminophen (NORCO)-if needed cyclobenzaprine (FLEXERIL)-if needed  As of today, STOP taking any Aspirin (unless otherwise instructed by your surgeon) Aleve, Naproxen, Ibuprofen, Motrin, Advil, Goody's, BC's, all herbal medications, fish oil, and all vitamins.                      Do not wear jewelry, make up, or nail polish            Do not wear lotions, powders, perfumes/colognes, or deodorant.            Do not shave 48 hours prior to surgery.  Men may shave face and neck.            Do not bring valuables to the hospital.            [facility] is not responsible for any belongings or valuables.  Do NOT Smoke (Tobacco/Vaping) 24 hours prior to your procedure If you use a CPAP at night, you may bring all equipment for your overnight stay.   Contacts, glasses, dentures or bridgework may not be worn into surgery.      For patients admitted to the hospital, discharge time will be determined  by your treatment team.   Patients discharged the day of surgery will not be allowed to drive home, and someone needs to stay with them for 24 hours.    Special instructions:   [facility]- Preparing For Surgery  Before surgery, you can play an important role. Because skin is not sterile, your skin needs to be as free of germs as possible. You can reduce the number of germs on your skin by washing with CHG (chlorahexidine gluconate) Soap before surgery.  CHG is an antiseptic cleaner which kills germs and bonds with the skin to continue killing germs even after washing.    Oral Hygiene is also important to reduce your risk of infection.  Remember - BRUSH YOUR TEETH THE MORNING OF SURGERY WITH YOUR REGULAR TOOTHPASTE  Please do not use if you have an allergy to CHG or antibacterial soaps. If your skin becomes reddened/irritated stop using the CHG.  Do not shave (including legs and underarms) for at least 48 hours prior to first CHG shower. It is OK to shave your face.  Please follow these instructions carefully.   Shower the NIGHT BEFORE SURGERY and the MORNING OF SURGERY with DIAL Soap.   Pat yourself dry with a CLEAN TOWEL.  Wear CLEAN PAJAMAS to bed the night before surgery  Place CLEAN SHEETS on your bed the night of your first shower and DO NOT SLEEP WITH PETS.   Day of Surgery: Please shower morning of surgery  Wear Clean/Comfortable clothing the morning of surgery Do not apply any deodorants/lotions.   Remember to brush your teeth WITH YOUR REGULAR TOOTHPASTE.   Questions were answered. Patient verbalized understanding of instructions.

## 2022-04-16 ENCOUNTER — Ambulatory Visit (HOSPITAL_COMMUNITY)
Admission: RE | Admit: 2022-04-16 | Discharge: 2022-04-16 | Disposition: A | Discharge status: Home / Self Care | Payer: Managed Care, Other (non HMO) | Attending: Surgery | Admitting: Surgery

## 2022-04-16 ENCOUNTER — Ambulatory Visit (HOSPITAL_COMMUNITY): Payer: Managed Care, Other (non HMO) | Admitting: Certified Registered Nurse Anesthetist

## 2022-04-16 ENCOUNTER — Encounter (HOSPITAL_COMMUNITY): Payer: Self-pay | Admitting: Surgery

## 2022-04-16 ENCOUNTER — Ambulatory Visit (HOSPITAL_BASED_OUTPATIENT_CLINIC_OR_DEPARTMENT_OTHER): Payer: Managed Care, Other (non HMO) | Admitting: Certified Registered Nurse Anesthetist

## 2022-04-16 ENCOUNTER — Other Ambulatory Visit: Payer: Self-pay

## 2022-04-16 ENCOUNTER — Encounter (HOSPITAL_COMMUNITY)
Admission: RE | Disposition: A | Discharge status: Home / Self Care | Payer: Self-pay | Source: Home / Self Care | Attending: Surgery

## 2022-04-16 DIAGNOSIS — Z6841 Body Mass Index (BMI) 40.0 and over, adult: Secondary | ICD-10-CM | POA: Insufficient documentation

## 2022-04-16 DIAGNOSIS — K219 Gastro-esophageal reflux disease without esophagitis: Secondary | ICD-10-CM | POA: Insufficient documentation

## 2022-04-16 DIAGNOSIS — J45909 Unspecified asthma, uncomplicated: Secondary | ICD-10-CM | POA: Insufficient documentation

## 2022-04-16 DIAGNOSIS — K8010 Calculus of gallbladder with chronic cholecystitis without obstruction: Secondary | ICD-10-CM | POA: Diagnosis not present

## 2022-04-16 DIAGNOSIS — I10 Essential (primary) hypertension: Secondary | ICD-10-CM

## 2022-04-16 DIAGNOSIS — G43909 Migraine, unspecified, not intractable, without status migrainosus: Secondary | ICD-10-CM | POA: Insufficient documentation

## 2022-04-16 DIAGNOSIS — K8020 Calculus of gallbladder without cholecystitis without obstruction: Secondary | ICD-10-CM

## 2022-04-16 DIAGNOSIS — K828 Other specified diseases of gallbladder: Secondary | ICD-10-CM | POA: Diagnosis not present

## 2022-04-16 HISTORY — DX: Gastro-esophageal reflux disease without esophagitis: K21.9

## 2022-04-16 HISTORY — DX: Essential (primary) hypertension: I10

## 2022-04-16 HISTORY — PX: CHOLECYSTECTOMY: SHX55

## 2022-04-16 LAB — POCT PREGNANCY, URINE: Preg Test, Ur: NEGATIVE

## 2022-04-16 SURGERY — LAPAROSCOPIC CHOLECYSTECTOMY
Anesthesia: General

## 2022-04-16 MED ORDER — CHLORHEXIDINE GLUCONATE 0.12 % MT SOLN: 15.0000 mL | Freq: Once | OROMUCOSAL | Status: AC

## 2022-04-16 MED ORDER — SUGAMMADEX SODIUM 200 MG/2ML IV SOLN
INTRAVENOUS | Status: DC | PRN
  Administered 2022-04-16: 300 mg via INTRAVENOUS

## 2022-04-16 MED ORDER — BUPIVACAINE-EPINEPHRINE 0.25% -1:200000 IJ SOLN
INTRAMUSCULAR | Status: DC | PRN
  Administered 2022-04-16: 30 mL

## 2022-04-16 MED ORDER — ONDANSETRON HCL 4 MG/2ML IJ SOLN
INTRAMUSCULAR | Status: DC | PRN
  Administered 2022-04-16: 4 mg via INTRAVENOUS

## 2022-04-16 MED ORDER — LIDOCAINE 2% (20 MG/ML) 5 ML SYRINGE
INTRAMUSCULAR | Status: AC
  Filled 2022-04-16: qty 5

## 2022-04-16 MED ORDER — MIDAZOLAM HCL 2 MG/2ML IJ SOLN
INTRAMUSCULAR | Status: AC
  Filled 2022-04-16: qty 2

## 2022-04-16 MED ORDER — HYDROMORPHONE HCL 1 MG/ML IJ SOLN: 0.2500 mg | INTRAMUSCULAR | Status: DC | PRN

## 2022-04-16 MED ORDER — KETOROLAC TROMETHAMINE 30 MG/ML IJ SOLN
INTRAMUSCULAR | Status: DC | PRN
  Administered 2022-04-16: 30 mg via INTRAVENOUS

## 2022-04-16 MED ORDER — ROCURONIUM BROMIDE 10 MG/ML (PF) SYRINGE
PREFILLED_SYRINGE | INTRAVENOUS | Status: DC | PRN
  Administered 2022-04-16: 60 mg via INTRAVENOUS

## 2022-04-16 MED ORDER — 0.9 % SODIUM CHLORIDE (POUR BTL) OPTIME
TOPICAL | Status: DC | PRN
  Administered 2022-04-16: 1000 mL

## 2022-04-16 MED ORDER — BUPIVACAINE-EPINEPHRINE (PF) 0.25% -1:200000 IJ SOLN
INTRAMUSCULAR | Status: AC
  Filled 2022-04-16: qty 30

## 2022-04-16 MED ORDER — OXYCODONE HCL 5 MG/5ML PO SOLN
ORAL | Status: AC
  Filled 2022-04-16: qty 5

## 2022-04-16 MED ORDER — FENTANYL CITRATE (PF) 250 MCG/5ML IJ SOLN
INTRAMUSCULAR | Status: DC | PRN
  Administered 2022-04-16: 100 ug via INTRAVENOUS
  Administered 2022-04-16 (×2): 50 ug via INTRAVENOUS

## 2022-04-16 MED ORDER — DEXAMETHASONE SODIUM PHOSPHATE 10 MG/ML IJ SOLN
INTRAMUSCULAR | Status: DC | PRN
  Administered 2022-04-16: 10 mg via INTRAVENOUS

## 2022-04-16 MED ORDER — ONDANSETRON HCL 4 MG/2ML IJ SOLN
INTRAMUSCULAR | Status: AC
  Filled 2022-04-16: qty 2

## 2022-04-16 MED ORDER — CEFAZOLIN IN SODIUM CHLORIDE 3-0.9 GM/100ML-% IV SOLN
3.0000 g | INTRAVENOUS | Status: AC
  Administered 2022-04-16: 3 g via INTRAVENOUS

## 2022-04-16 MED ORDER — OXYCODONE HCL 5 MG/5ML PO SOLN
5.0000 mg | ORAL | Status: DC | PRN
  Administered 2022-04-16: 5 mg via ORAL

## 2022-04-16 MED ORDER — SODIUM CHLORIDE 0.9 % IR SOLN
Status: DC | PRN
  Administered 2022-04-16: 1000 mL

## 2022-04-16 MED ORDER — LIDOCAINE 2% (20 MG/ML) 5 ML SYRINGE
INTRAMUSCULAR | Status: DC | PRN
  Administered 2022-04-16: 60 mg via INTRAVENOUS

## 2022-04-16 MED ORDER — CEFAZOLIN IN SODIUM CHLORIDE 3-0.9 GM/100ML-% IV SOLN
INTRAVENOUS | Status: AC
  Filled 2022-04-16: qty 100

## 2022-04-16 MED ORDER — GABAPENTIN 300 MG PO CAPS
ORAL_CAPSULE | ORAL | Status: AC
  Administered 2022-04-16: 300 mg via ORAL
  Filled 2022-04-16: qty 1

## 2022-04-16 MED ORDER — MIDAZOLAM HCL 2 MG/2ML IJ SOLN
INTRAMUSCULAR | Status: DC | PRN
  Administered 2022-04-16: 2 mg via INTRAVENOUS

## 2022-04-16 MED ORDER — CHLORHEXIDINE GLUCONATE 0.12 % MT SOLN
OROMUCOSAL | Status: AC
  Administered 2022-04-16: 15 mL via OROMUCOSAL
  Filled 2022-04-16: qty 15

## 2022-04-16 MED ORDER — LACTATED RINGERS IV SOLN: INTRAVENOUS | Status: DC

## 2022-04-16 MED ORDER — PROPOFOL 10 MG/ML IV BOLUS
INTRAVENOUS | Status: DC | PRN
  Administered 2022-04-16: 150 mg via INTRAVENOUS

## 2022-04-16 MED ORDER — HYDROCODONE-ACETAMINOPHEN 10-325 MG PO TABS: 1.0000 | ORAL_TABLET | Freq: Four times a day (QID) | ORAL | 0 refills | Status: AC | PRN

## 2022-04-16 MED ORDER — ROCURONIUM BROMIDE 10 MG/ML (PF) SYRINGE
PREFILLED_SYRINGE | INTRAVENOUS | Status: AC
  Filled 2022-04-16: qty 10

## 2022-04-16 MED ORDER — PROPOFOL 10 MG/ML IV BOLUS
INTRAVENOUS | Status: AC
  Filled 2022-04-16: qty 20

## 2022-04-16 MED ORDER — GABAPENTIN 300 MG PO CAPS: 300.0000 mg | ORAL_CAPSULE | ORAL | Status: AC

## 2022-04-16 MED ORDER — FENTANYL CITRATE (PF) 250 MCG/5ML IJ SOLN
INTRAMUSCULAR | Status: AC
  Filled 2022-04-16: qty 5

## 2022-04-16 MED ORDER — ACETAMINOPHEN 500 MG PO TABS
1000.0000 mg | ORAL_TABLET | ORAL | Status: AC
  Administered 2022-04-16: 1000 mg via ORAL
  Filled 2022-04-16: qty 2

## 2022-04-16 MED ORDER — ORAL CARE MOUTH RINSE: 15.0000 mL | Freq: Once | OROMUCOSAL | Status: AC

## 2022-04-16 SURGICAL SUPPLY — 54 items
ADH SKN CLS APL DERMABOND .7 (GAUZE/BANDAGES/DRESSINGS) ×1
APL PRP STRL LF DISP 70% ISPRP (MISCELLANEOUS) ×1
APPLIER CLIP 5 13 M/L LIGAMAX5 (MISCELLANEOUS) ×2
APR CLP MED LRG 5 ANG JAW (MISCELLANEOUS) ×1
BAG COUNTER SPONGE SURGICOUNT (BAG) ×3 IMPLANT
BAG SPEC RTRVL LRG 6X4 10 (ENDOMECHANICALS) ×1
BAG SPNG CNTER NS LX DISP (BAG) ×1
BLADE CLIPPER SURG (BLADE) IMPLANT
CANISTER SUCT 3000ML PPV (MISCELLANEOUS) ×3 IMPLANT
CHLORAPREP W/TINT 26 (MISCELLANEOUS) ×3 IMPLANT
CLIP APPLIE 5 13 M/L LIGAMAX5 (MISCELLANEOUS) ×2 IMPLANT
CNTNR URN SCR LID CUP LEK RST (MISCELLANEOUS) IMPLANT
CONT SPEC 4OZ STRL OR WHT (MISCELLANEOUS) ×2
COVER SURGICAL LIGHT HANDLE (MISCELLANEOUS) ×3 IMPLANT
DERMABOND ADVANCED (GAUZE/BANDAGES/DRESSINGS) ×1
DERMABOND ADVANCED .7 DNX12 (GAUZE/BANDAGES/DRESSINGS) ×2 IMPLANT
ELECT CAUTERY BLADE 6.4 (BLADE) ×1 IMPLANT
ELECT REM PT RETURN 9FT ADLT (ELECTROSURGICAL) ×2
ELECTRODE REM PT RTRN 9FT ADLT (ELECTROSURGICAL) ×2 IMPLANT
GLOVE BIOGEL PI IND STRL 6 (GLOVE) ×2 IMPLANT
GLOVE BIOGEL PI INDICATOR 6 (GLOVE) ×1
GLOVE BIOGEL PI MICRO 5.5 (GLOVE) ×1
GLOVE BIOGEL PI MICRO STRL 5.5 (GLOVE) ×2 IMPLANT
GOWN STRL REUS W/ TWL LRG LVL3 (GOWN DISPOSABLE) ×6 IMPLANT
GOWN STRL REUS W/ TWL XL LVL3 (GOWN DISPOSABLE) IMPLANT
GOWN STRL REUS W/TWL LRG LVL3 (GOWN DISPOSABLE) ×4
GOWN STRL REUS W/TWL XL LVL3 (GOWN DISPOSABLE) ×2
KIT BASIN OR (CUSTOM PROCEDURE TRAY) ×3 IMPLANT
KIT TURNOVER KIT B (KITS) ×3 IMPLANT
L-HOOK LAP DISP 36CM (ELECTROSURGICAL) ×2
LHOOK LAP DISP 36CM (ELECTROSURGICAL) ×2 IMPLANT
NDL INSUFFLATION 14GA 120MM (NEEDLE) IMPLANT
NEEDLE INSUFFLATION 14GA 120MM (NEEDLE) IMPLANT
NS IRRIG 1000ML POUR BTL (IV SOLUTION) ×3 IMPLANT
PAD ARMBOARD 7.5X6 YLW CONV (MISCELLANEOUS) ×3 IMPLANT
PENCIL BUTTON HOLSTER BLD 10FT (ELECTRODE) ×3 IMPLANT
POUCH SPECIMEN RETRIEVAL 10MM (ENDOMECHANICALS) ×3 IMPLANT
SCISSORS LAP 5X35 DISP (ENDOMECHANICALS) ×3 IMPLANT
SET IRRIG TUBING LAPAROSCOPIC (IRRIGATION / IRRIGATOR) ×3 IMPLANT
SET TUBE SMOKE EVAC HIGH FLOW (TUBING) ×3 IMPLANT
SLEEVE ENDOPATH XCEL 5M (ENDOMECHANICALS) ×6 IMPLANT
SLEEVE Z-THREAD 5X100MM (TROCAR) ×2 IMPLANT
SUT MNCRL AB 4-0 PS2 18 (SUTURE) ×4 IMPLANT
SUT VIC AB 3-0 SH 27 (SUTURE) ×2
SUT VIC AB 3-0 SH 27XBRD (SUTURE) IMPLANT
SUT VICRYL 0 UR6 27IN ABS (SUTURE) ×2 IMPLANT
TOWEL GREEN STERILE (TOWEL DISPOSABLE) ×3 IMPLANT
TOWEL GREEN STERILE FF (TOWEL DISPOSABLE) ×3 IMPLANT
TRAY LAPAROSCOPIC MC (CUSTOM PROCEDURE TRAY) ×3 IMPLANT
TROCAR BALLN 12MMX100 BLUNT (TROCAR) ×1 IMPLANT
TROCAR XCEL 12X100 BLDLESS (ENDOMECHANICALS) IMPLANT
TROCAR Z-THREAD OPTICAL 5X100M (TROCAR) ×1 IMPLANT
WARMER LAPAROSCOPE (MISCELLANEOUS) ×3 IMPLANT
WATER STERILE IRR 1000ML POUR (IV SOLUTION) ×3 IMPLANT

## 2022-04-16 NOTE — Transfer of Care (Signed)
Immediate Anesthesia Transfer of Care Note  Patient: Jenna Ayala  Procedure(s) Performed: LAPAROSCOPIC CHOLECYSTECTOMY  Patient Location: PACU  Anesthesia Type:General  Level of Consciousness: awake, alert  and oriented  Airway & Oxygen Therapy: Patient Spontanous Breathing and Patient connected to face mask oxygen  Post-op Assessment: Report given to RN and Post -op Vital signs reviewed and stable  Post vital signs: Reviewed and stable  Last Vitals:  Vitals Value Taken Time  BP 114/66 04/16/22 1046  Temp    Pulse 88 04/16/22 1047  Resp 14 04/16/22 1047  SpO2 100 % 04/16/22 1047  Vitals shown include unvalidated device data.  Last Pain:  Vitals:   04/16/22 0847  TempSrc:   PainSc: 0-No pain         Complications: No notable events documented.

## 2022-04-16 NOTE — Interval H&P Note (Signed)
History and Physical Interval Note:  04/16/2022 8:33 AM  [name]  has presented today for surgery, with the diagnosis of GALLSTONES.  The various methods of treatment have been discussed with the patient and family. After consideration of risks, benefits and other options for treatment, the patient has consented to  Procedure(s): LAPAROSCOPIC CHOLECYSTECTOMY (N/A) as a surgical intervention.  The patient's history has been reviewed, patient examined, no change in status, stable for surgery.  I have reviewed the patient's chart and labs.  Questions were answered to the patient's satisfaction.     Jenna Ayala

## 2022-04-16 NOTE — Anesthesia Procedure Notes (Signed)
Procedure Name: Intubation Date/Time: 04/16/2022 9:48 AM Performed by: Carolan Clines, CRNA Pre-anesthesia Checklist: Patient identified, Emergency Drugs available, Suction available and Patient being monitored Patient Re-evaluated:Patient Re-evaluated prior to induction Oxygen Delivery Method: Circle System Utilized Preoxygenation: Pre-oxygenation with 100% oxygen Induction Type: IV induction Ventilation: Mask ventilation without difficulty Laryngoscope Size: Mac and 3 Grade View: Grade I Tube type: Oral Tube size: 7.0 mm Number of attempts: 1 Airway Equipment and Method: Stylet Placement Confirmation: ETT inserted through vocal cords under direct vision, positive ETCO2 and breath sounds checked- equal and bilateral Secured at: 23 cm Tube secured with: Tape Dental Injury: Teeth and Oropharynx as per pre-operative assessment

## 2022-04-16 NOTE — Discharge Instructions (Addendum)
[facility] DISCHARGE INSTRUCTIONS  Activity No heavy lifting greater than 15 pounds for 4 weeks after surgery. Ok to shower in 24 hours, but do not bathe or submerge incisions underwater. Do not drive while taking narcotic pain medication.  Wound Care Your incisions are covered with skin glue called Dermabond. This will peel off on its own over time. You may shower and allow warm soapy water to run over your incisions. Gently pat dry. Do not submerge your incision underwater. Monitor your incision for any new redness, tenderness, or drainage.  When to Call us: Fever greater than 100.5 New redness, drainage, or swelling at incision site Severe pain, nausea, or vomiting Jaundice (yellowing of the whites of the eyes or skin)  Follow-up You have an appointment scheduled with Dr. Zenia Resides on May 05, 2022 at 3:00pm. This will be at the [facility] Surgery office at [address] [place], [place], [place]. Please arrive at least 15 minutes prior to your scheduled appointment time.  For questions or concerns, please call the office at [phone].

## 2022-04-16 NOTE — Op Note (Signed)
Date: 04/16/22  Patient: Jenna Ayala MRN: [id number]  Preoperative Diagnosis: Symptomatic cholelithiasis Postoperative Diagnosis: Same  Procedure: Laparoscopic cholecystectomy  Surgeon: Michaelle Birks, MD  EBL: Minimal  Anesthesia: General endotracheal  Specimens: Gallbladder  Indications: Ms. Dizdarevic is a 46 yo female who presented with intermittent RUQ and epigastric pain, worse after eating. RUQ US showed a large stone within the gallbladder. After a discussion of the risks and benefits of surgery, she agreed to proceed with elective cholecystectomy.  Findings: Large stone within the gallbladder. Chronic cholecystitis.  Procedure details: Informed consent was obtained in the preoperative area prior to the procedure. The patient was brought to the operating room and placed on the table in the supine position. General anesthesia was induced and appropriate lines and drains were placed for intraoperative monitoring. Perioperative antibiotics were administered per SCIP guidelines. The abdomen was prepped and draped in the usual sterile fashion. A pre-procedure timeout was taken verifying patient identity, surgical site and procedure to be performed.  A small infraumbilical skin incision was made, the subcutaneous tissue was divided with cautery, and the umbilical stalk was grasped and elevated. The fascia was incised and the peritoneal cavity was directly visualized. A 77m Hassan trocar was placed and the abdomen was insufflated. The peritoneal cavity was inspected with no evidence of visceral or vascular injury. Three 592mports were placed in the right subcostal margin, all under direct visualization. The fundus of the gallbladder was grasped and retracted cephalad.  There were omental adhesions to the gallbladder which were taken down using cautery.  The infundibulum was retracted laterally. The cystic triangle was dissected out using cautery and blunt dissection, and the critical view of  safety was obtained. The cystic duct and cystic artery were clipped and ligated, leaving 2 clips behind each on the cystic duct and artery stumps. The gallbladder was taken off the liver using cautery, and the specimen was placed in an endocatch bag. The surgical site was irrigated with saline until the effluent was clear. Hemostasis was achieved in the gallbladder fossa using cautery. The cystic duct and artery stumps were visually inspected and there was no evidence of bile leak or bleeding. The ports were removed under direct visualization and the abdomen was desufflated. The specimen was extracted via the umbilical port site.  There was a large stone palpable within the gallbladder; the specimen was sent for routine pathology. The umbilical port site fascia was closed with a 0 vicryl suture. The skin at all port sites was closed with 4-0 monocryl subcuticular suture. Dermabond was applied.  The patient tolerated the procedure well with no apparent complications.  All counts were correct x2 at the end of the procedure. The patient was extubated and taken to PACU in stable condition.  ShMichaelle BirksMD 04/16/22 10:41 AM

## 2022-04-16 NOTE — Anesthesia Postprocedure Evaluation (Signed)
Anesthesia Post Note  Patient: Jenna Ayala  Procedure(s) Performed: LAPAROSCOPIC CHOLECYSTECTOMY     Patient location during evaluation: PACU Anesthesia Type: General Level of consciousness: awake and alert Pain management: pain level controlled Vital Signs Assessment: post-procedure vital signs reviewed and stable Respiratory status: spontaneous breathing, nonlabored ventilation and respiratory function stable Cardiovascular status: blood pressure returned to baseline and stable Postop Assessment: no apparent nausea or vomiting Anesthetic complications: no   No notable events documented.  Last Vitals:  Vitals:   04/16/22 1115 04/16/22 1130  BP: 135/86 131/85  Pulse: 69 68  Resp: 15 17  Temp:    SpO2: 92% 92%    Last Pain:  Vitals:   04/16/22 1115  TempSrc:   PainSc: 3                  Misao Fackrell,W. EDMOND

## 2022-04-16 NOTE — Anesthesia Preprocedure Evaluation (Addendum)
Anesthesia Evaluation  Patient identified by MRN, date of birth, ID band Patient awake    Reviewed: Allergy & Precautions, H&P , NPO status , Patient's Chart, lab work & pertinent test results  Airway Mallampati: II  TM Distance: >3 FB Neck ROM: Full    Dental no notable dental hx. (+) Teeth Intact, Dental Advisory Given   Pulmonary asthma ,    Pulmonary exam normal breath sounds clear to auscultation       Cardiovascular hypertension, Pt. on medications  Rhythm:Regular Rate:Normal     Neuro/Psych  Headaches, negative psych ROS   GI/Hepatic Neg liver ROS, GERD  Medicated,  Endo/Other  Morbid obesity  Renal/GU negative Renal ROS  negative genitourinary   Musculoskeletal   Abdominal   Peds  Hematology negative hematology ROS (+)   Anesthesia Other Findings   Reproductive/Obstetrics negative OB ROS                            Anesthesia Physical Anesthesia Plan  ASA: 3  Anesthesia Plan: General   Post-op Pain Management: Tylenol PO (pre-op)* and Gabapentin PO (pre-op)*   Induction: Intravenous  PONV Risk Score and Plan: 4 or greater and Ondansetron, Dexamethasone and Midazolam  Airway Management Planned: Oral ETT  Additional Equipment:   Intra-op Plan:   Post-operative Plan: Extubation in OR  Informed Consent: I have reviewed the patients History and Physical, chart, labs and discussed the procedure including the risks, benefits and alternatives for the proposed anesthesia with the patient or authorized representative who has indicated his/her understanding and acceptance.     Dental advisory given  Plan Discussed with: CRNA  Anesthesia Plan Comments:         Anesthesia Quick Evaluation

## 2022-04-17 ENCOUNTER — Encounter (HOSPITAL_COMMUNITY): Payer: Self-pay | Admitting: Surgery

## 2022-04-17 LAB — SURGICAL PATHOLOGY

## 2022-04-21 ENCOUNTER — Ambulatory Visit (INDEPENDENT_AMBULATORY_CARE_PROVIDER_SITE_OTHER): Payer: Managed Care, Other (non HMO) | Admitting: Sports Medicine

## 2022-04-21 ENCOUNTER — Ambulatory Visit (INDEPENDENT_AMBULATORY_CARE_PROVIDER_SITE_OTHER): Payer: Managed Care, Other (non HMO)

## 2022-04-21 ENCOUNTER — Other Ambulatory Visit: Payer: Self-pay | Admitting: Sports Medicine

## 2022-04-21 ENCOUNTER — Telehealth: Payer: Self-pay | Admitting: Sports Medicine

## 2022-04-21 VITALS — BP 134/77 | HR 95 | Ht 67.0 in | Wt 295.0 lb

## 2022-04-21 DIAGNOSIS — M542 Cervicalgia: Secondary | ICD-10-CM | POA: Diagnosis not present

## 2022-04-21 DIAGNOSIS — Z981 Arthrodesis status: Secondary | ICD-10-CM

## 2022-04-21 DIAGNOSIS — M546 Pain in thoracic spine: Secondary | ICD-10-CM | POA: Diagnosis not present

## 2022-04-21 MED ORDER — CYCLOBENZAPRINE HCL 5 MG PO TABS: 5.0000 mg | ORAL_TABLET | Freq: Three times a day (TID) | ORAL | 0 refills | Status: AC | PRN

## 2022-04-21 MED ORDER — MELOXICAM 15 MG PO TABS
15.0000 mg | ORAL_TABLET | Freq: Every day | ORAL | 0 refills | Status: AC
Start: 2022-04-21 — End: ?

## 2022-04-21 NOTE — Telephone Encounter (Signed)
Flexeril was put in

## 2022-04-21 NOTE — Progress Notes (Signed)
Jenna Ayala D.Ashley [facility]  Phone: [phone]   Assessment and Plan:     1. Neck pain 2. Acute bilateral thoracic back pain 3. S/P cervical spinal fusion -Chronic with exacerbation, initial sports medicine visit - Significant neck pain, worse on the left, radiating into the left upper arm over the past 6 weeks.  This may be due to muscular strains due to overcompensation with patient experiencing a gallbladder attack 6 weeks ago, and having cholecystectomy last week.  This also could be progressive worsening from changes occurring with history of C6/C7 spinal fusion - Start HEP for neck range of motion - Start meloxicam 15 mg daily x2 weeks.  If still having pain after 2 weeks, complete 3rd-week of meloxicam. May use remaining meloxicam as needed once daily for pain control.  Do not to use additional NSAIDs while taking meloxicam.  May use Tylenol [phone] mg 2 to 3 times a day for breakthrough pain. - X-ray obtained in clinic.  My interpretation: No acute fracture or dislocation.  History of spinal fusion with hardware seen at C6-C7 and resulting cortical changes in this area.  Anterior vertebral spurring of C5  4.  Right thoracic pain - Subacute, improving, initial sports medicine visit - Suspect that this is likely from patient's gallbladder attack resulting in inflamed surrounding tissues that are slowly improving after cholecystectomy    Pertinent previous records reviewed include H&P note 04/10/2022   Follow Up: 3 weeks for reevaluation.  Could consider advanced imaging of C-spine versus x-ray image and potentially gets imaging of T-spine based on patient's symptoms   Subjective:   I, [name], am serving as a Education administrator for Doctor [employer]  Chief Complaint: neck pain that goes to left arm and back pain   HPI:   04/21/22 Patient is a 46 year old female complaining of neck pain that goes to  left arm and back pain. Patient states the end of April she had a gallbladder attack , had discomfort on the front and the back , back  discomfort never went away, intermittent neck pain , middle of may neck pain on the left side that radiates down her arm, gallbladder removed last week and still having neck and back pain, has tried hot and cold and that doesn't seem to be removing the problem, tylenol doesn't work, hydrocodone takes the pain away for a little bit but the pain always comes back on the neck and back, when she sits it feels like there is a bulge or something there   Relevant Historical Information: Cholecystectomy 04/16/2022, elevated BMI  Additional pertinent review of systems negative.   Current Outpatient Medications:    acyclovir (ZOVIRAX) 400 MG tablet, Take 1 tablet (400 mg total) by mouth 3 (three) times daily. For 5 days for outbreaks (Patient taking differently: Take 400 mg by mouth 3 (three) times daily as needed (outbreaks). For 5 days for outbreaks), Disp: 45 tablet, Rfl: 2   cyclobenzaprine (FLEXERIL) 10 MG tablet, Take 10 mg by mouth 3 (three) times daily as needed for muscle spasms., Disp: , Rfl:    diltiazem (CARDIZEM LA) 180 MG 24 hr tablet, Take 1 tablet (180 mg total) by mouth daily., Disp: 90 tablet, Rfl: 3   diphenhydrAMINE (BENADRYL) 25 mg capsule, Take 25 mg by mouth daily as needed for allergies., Disp: , Rfl:    HYDROcodone-acetaminophen (NORCO) 10-325 MG tablet, Take 1 tablet by mouth every 6 (six) hours  as needed for up to 5 days for severe pain., Disp: 15 tablet, Rfl: 0   ibuprofen (ADVIL,MOTRIN) 200 MG tablet, Take 800 mg by mouth every 8 (eight) hours as needed for moderate pain., Disp: , Rfl:    meloxicam (MOBIC) 15 MG tablet, Take 1 tablet (15 mg total) by mouth daily., Disp: 30 tablet, Rfl: 0   omeprazole (PRILOSEC) 20 MG capsule, Take 1 capsule (20 mg total) by mouth daily., Disp: 30 capsule, Rfl: 3   triamcinolone cream (KENALOG) 0.1 %, Apply 1  application topically 2 (two) times daily. For 2 weeks, then as needed (Patient taking differently: Apply 1 application  topically 2 (two) times daily as needed (dry skin).), Disp: 80 g, Rfl: 1   Objective:     Vitals:   04/21/22 1244  BP: 134/77  Pulse: 95  SpO2: 99%  Weight: 295 lb (133.8 kg)  Height: 5' 7"  (1.702 m)      Body mass index is 46.2 kg/m.    Physical Exam:    Cervical Spine: Posture normal Skin: normal, intact  Neurological:   Strength:  Left Right   Deltoid 5/5 5/5  Bicep 5/5  5/5  Tricep 5/5 5/5  Wrist Flexion 5/5 5/5  Wrist Extension 5/5 5/5  Grip 4+/5 5/5  Finger Abduction 5/5 5/5   Sensation: Tingling sensation over left third digit compared to right, otherwise intact to light touch in upper extremities bilaterally  Spurling's:  negative bilaterally Neck ROM: Full active ROM TTP: Cervical paraspinal, worse on left.  Thoracic paraspinal.  Mildly along left trapezius NTTP: cervical spinous processes, right trapezius    Electronically signed by:  Jenna Ayala D.[facility] 1:40 PM 04/21/22

## 2022-04-21 NOTE — Telephone Encounter (Signed)
Patient's husband called in regards to the flexeril prescription.  Can this be sent in for her please?

## 2022-04-21 NOTE — Patient Instructions (Addendum)
Good to see you  - Start meloxicam 15 mg daily x2 weeks.  If still having pain after 2 weeks, complete 3rd-week of meloxicam. May use remaining meloxicam as needed once daily for pain control.  Do not to use additional NSAIDs while taking meloxicam.  May use Tylenol [phone] mg 2 to 3 times a day for breakthrough pain. Take flexeril as needed for muscle spasms Neck HEP  3 week follow up

## 2022-04-28 ENCOUNTER — Telehealth: Payer: Self-pay | Admitting: Sports Medicine

## 2022-04-28 ENCOUNTER — Other Ambulatory Visit: Payer: Self-pay | Admitting: Sports Medicine

## 2022-04-28 MED ORDER — CYCLOBENZAPRINE HCL 10 MG PO TABS: 10.0000 mg | ORAL_TABLET | Freq: Every evening | ORAL | 0 refills | Status: AC | PRN

## 2022-04-28 MED ORDER — GABAPENTIN 100 MG PO CAPS: 100.0000 mg | ORAL_CAPSULE | Freq: Every day | ORAL | 0 refills | Status: AC

## 2022-04-28 NOTE — Telephone Encounter (Signed)
Patient called stating that the Meloxicam that was prescribed has not helped the pain and numbness in her arm and the numbness has now moved up to her shoulder. Is there anything else she can try?

## 2022-04-28 NOTE — Telephone Encounter (Signed)
Gabapentin and flexeril were placed at pt pharmacy

## 2022-04-30 ENCOUNTER — Other Ambulatory Visit: Payer: Self-pay | Admitting: Family Medicine

## 2022-04-30 DIAGNOSIS — N6002 Solitary cyst of left breast: Secondary | ICD-10-CM

## 2022-05-01 ENCOUNTER — Ambulatory Visit: Payer: Managed Care, Other (non HMO) | Admitting: Family Medicine

## 2022-05-05 ENCOUNTER — Encounter: Payer: Self-pay | Admitting: Surgery

## 2022-05-07 NOTE — Progress Notes (Signed)
Jenna Ayala D.[place] Phone: [phone]   Assessment and Plan:     1. Neck pain 2. S/P cervical spinal fusion 3. Left arm weakness - Chronic with exacerbation, subsequent visit - Continued neck pain with progressive left upper extremity weakness - I suspect that with patient's history of cervical spinal fusion, resulting cervical changes, she likely has nerve impingement. With left arm weakness being present and worse than prior office visit, we will order MRI C spine for further evaluation. - Gabapentin was mildly beneficial for patient's symptoms, so increase to gabapentin 100 mg 3 times daily - May use remaining Flexeril as needed - Discontinue meloxicam as it was not helpful - gabapentin (NEURONTIN) 100 MG capsule; Take 1 capsule (100 mg total) by mouth 3 (three) times daily.  Dispense: 90 capsule; Refill: 0 - MR CERVICAL SPINE WO CONTRAST; Future    Pertinent previous records reviewed include none   Follow Up: 3 days after MRI to review results and discuss treatment plan   Subjective:   I, Jenna Ayala, am serving as a Education administrator for Doctor [employer]   Chief Complaint: neck pain that goes to left arm and back pain    HPI:    04/21/22 Patient is a 46 year old female complaining of neck pain that goes to left arm and back pain. Patient states the end of April she had a gallbladder attack , had discomfort on the front and the back , back  discomfort never went away, intermittent neck pain , middle of may neck pain on the left side that radiates down her arm, gallbladder removed last week and still having neck and back pain, has tried hot and cold and that doesn't seem to be removing the problem, tylenol doesn't work, hydrocodone takes the pain away for a little bit but the pain always comes back on the neck and back, when she sits it feels like there is a bulge or something there    05/14/2022 Patient states gabapentin helped it is dulling the pain, not as stiff from the HEP and the meds she states something is not right, now the pain is radiating out , still arm soreness from muscle spasm , neds not lasting the 24 hours more like 6-8 hours    Relevant Historical Information: Cholecystectomy 04/16/2022, elevated BMI  Additional pertinent review of systems negative.   Current Outpatient Medications:    acyclovir (ZOVIRAX) 400 MG tablet, Take 1 tablet (400 mg total) by mouth 3 (three) times daily. For 5 days for outbreaks (Patient taking differently: Take 400 mg by mouth 3 (three) times daily as needed (outbreaks). For 5 days for outbreaks), Disp: 45 tablet, Rfl: 2   cyclobenzaprine (FLEXERIL) 10 MG tablet, Take 10 mg by mouth 3 (three) times daily as needed for muscle spasms., Disp: , Rfl:    cyclobenzaprine (FLEXERIL) 10 MG tablet, Take 1 tablet (10 mg total) by mouth at bedtime as needed for muscle spasms., Disp: 15 tablet, Rfl: 0   cyclobenzaprine (FLEXERIL) 5 MG tablet, Take 1 tablet (5 mg total) by mouth 3 (three) times daily as needed for muscle spasms., Disp: 15 tablet, Rfl: 0   diltiazem (CARDIZEM LA) 180 MG 24 hr tablet, Take 1 tablet (180 mg total) by mouth daily., Disp: 90 tablet, Rfl: 3   diphenhydrAMINE (BENADRYL) 25 mg capsule, Take 25 mg by mouth daily as needed for allergies., Disp: , Rfl:    gabapentin (NEURONTIN)  100 MG capsule, Take 1 capsule (100 mg total) by mouth daily., Disp: 30 capsule, Rfl: 0   gabapentin (NEURONTIN) 100 MG capsule, Take 1 capsule (100 mg total) by mouth 3 (three) times daily., Disp: 90 capsule, Rfl: 0   ibuprofen (ADVIL,MOTRIN) 200 MG tablet, Take 800 mg by mouth every 8 (eight) hours as needed for moderate pain., Disp: , Rfl:    meloxicam (MOBIC) 15 MG tablet, Take 1 tablet (15 mg total) by mouth daily., Disp: 30 tablet, Rfl: 0   omeprazole (PRILOSEC) 20 MG capsule, Take 1 capsule (20 mg total) by mouth daily., Disp: 30 capsule, Rfl:  3   triamcinolone cream (KENALOG) 0.1 %, Apply 1 application topically 2 (two) times daily. For 2 weeks, then as needed (Patient taking differently: Apply 1 application  topically 2 (two) times daily as needed (dry skin).), Disp: 80 g, Rfl: 1   Objective:     Vitals:   05/14/22 1453  BP: 130/75  Pulse: 88  SpO2: 99%  Weight: 291 lb (132 kg)  Height: 5' 7"  (1.702 m)      Body mass index is 45.58 kg/m.    Physical Exam:    Cervical Spine: Posture normal Skin: normal, intact  Neurological:   Strength:  Right  Left   Deltoid 5/5 5/5  Bicep 5/5  5/5  Tricep 5/5 5/5  Wrist Flexion 5/5 5/5  Wrist Extension 5/5 4/5  Grip 5/5 4/5  Finger Abduction 5/5 4/5   Sensation: Decreased sensation over left medial upper arm, otherwise intact to light touch in upper extremities bilaterally  Spurling's:  negative bilaterally Neck ROM: Full active ROM   NTTP: cervical spinous processes, cervical paraspinal, thoracic paraspinal, trapezius    Electronically signed by:  Jenna Ayala D.[facility] 4:51 PM 05/14/22

## 2022-05-14 ENCOUNTER — Ambulatory Visit (INDEPENDENT_AMBULATORY_CARE_PROVIDER_SITE_OTHER): Payer: Managed Care, Other (non HMO) | Admitting: Sports Medicine

## 2022-05-14 VITALS — BP 130/75 | HR 88 | Ht 67.0 in | Wt 291.0 lb

## 2022-05-14 DIAGNOSIS — M542 Cervicalgia: Secondary | ICD-10-CM

## 2022-05-14 DIAGNOSIS — R29898 Other symptoms and signs involving the musculoskeletal system: Secondary | ICD-10-CM

## 2022-05-14 DIAGNOSIS — Z981 Arthrodesis status: Secondary | ICD-10-CM

## 2022-05-14 MED ORDER — GABAPENTIN 100 MG PO CAPS: 100.0000 mg | ORAL_CAPSULE | Freq: Three times a day (TID) | ORAL | 0 refills | Status: AC

## 2022-05-14 NOTE — Patient Instructions (Addendum)
Good to see you  C spine MRI referral  Gabapentin 100 mg 3 times a day  Follow up 3 days after your MRI to discuss your results

## 2022-05-20 ENCOUNTER — Telehealth: Payer: Self-pay | Admitting: Sports Medicine

## 2022-05-20 NOTE — Telephone Encounter (Signed)
Patient called to follow up on the MRI order that was going to be put in for her.  She said that she had talked to Dr Jean Rosenthal about ordering her neck but further down as well due to the pain she is having. She said that it is from her neck, to her shoulder blade and down her back. She also said that the top of her left leg hurts when she picks her leg up. Dr Jean Rosenthal was going to look at past scans that were done to see if it would show anything below her neck to avoid ordering another MRI. Did he see anything that could be helpful? If not, can more of her back be ordered?  Please advise.

## 2022-05-21 NOTE — Telephone Encounter (Signed)
Spoke with pt, will move forward with neck MRI and discuss after that. Pre cert approved, pt given [facility] phone # for MRI scheduling.

## 2022-05-21 NOTE — Telephone Encounter (Signed)
Left message for patient to call back to discuss and schedule.

## 2022-05-25 ENCOUNTER — Ambulatory Visit (INDEPENDENT_AMBULATORY_CARE_PROVIDER_SITE_OTHER): Payer: Managed Care, Other (non HMO)

## 2022-05-25 DIAGNOSIS — Z981 Arthrodesis status: Secondary | ICD-10-CM

## 2022-05-25 DIAGNOSIS — R29898 Other symptoms and signs involving the musculoskeletal system: Secondary | ICD-10-CM

## 2022-05-25 DIAGNOSIS — M542 Cervicalgia: Secondary | ICD-10-CM | POA: Diagnosis not present

## 2022-05-25 DIAGNOSIS — G8929 Other chronic pain: Secondary | ICD-10-CM | POA: Diagnosis not present

## 2022-05-26 NOTE — Progress Notes (Unsigned)
Jenna Ayala [facility] [address] [place] [address] Phone: [phone]   Assessment and Plan:     There are no diagnoses linked to this encounter.  ***   Pertinent previous records reviewed include ***   Follow Up: ***     Subjective:   I, Jenna Ayala, am serving as a Neurosurgeon for Jenna Ayala   Chief Complaint: neck pain that goes to left arm and back pain    HPI:    04/21/22 Patient is a 46 year old female complaining of neck pain that goes to left arm and back pain. Patient states the end of April she had a gallbladder attack , had discomfort on the front and the back , back  discomfort never went away, intermittent neck pain , middle of may neck pain on the left side that radiates down her arm, gallbladder removed last week and still having neck and back pain, has tried hot and cold and that doesn't seem to be removing the problem, tylenol doesn't work, hydrocodone takes the pain away for a little bit but the pain always comes back on the neck and back, when she sits it feels like there is a bulge or something there    05/14/2022 Patient states gabapentin helped it is dulling the pain, not as stiff from the HEP and the meds she states something is not right, now the pain is radiating out , still arm soreness from muscle spasm , neds not lasting the 24 hours more like 6-8 hours   05/27/2022 Patient states    Relevant Historical Information: Cholecystectomy 04/16/2022, elevated BMI  Additional pertinent review of systems negative.   Current Outpatient Medications:    acyclovir (ZOVIRAX) 400 MG tablet, Take 1 tablet (400 mg total) by mouth 3 (three) times daily. For 5 days for outbreaks (Patient taking differently: Take 400 mg by mouth 3 (three) times daily as needed (outbreaks). For 5 days for outbreaks), Disp: 45 tablet, Rfl: 2   cyclobenzaprine (FLEXERIL) 10 MG tablet, Take 10 mg by mouth 3 (three) times daily as  needed for muscle spasms., Disp: , Rfl:    cyclobenzaprine (FLEXERIL) 10 MG tablet, Take 1 tablet (10 mg total) by mouth at bedtime as needed for muscle spasms., Disp: 15 tablet, Rfl: 0   cyclobenzaprine (FLEXERIL) 5 MG tablet, Take 1 tablet (5 mg total) by mouth 3 (three) times daily as needed for muscle spasms., Disp: 15 tablet, Rfl: 0   diltiazem (CARDIZEM LA) 180 MG 24 hr tablet, Take 1 tablet (180 mg total) by mouth daily., Disp: 90 tablet, Rfl: 3   diphenhydrAMINE (BENADRYL) 25 mg capsule, Take 25 mg by mouth daily as needed for allergies., Disp: , Rfl:    gabapentin (NEURONTIN) 100 MG capsule, Take 1 capsule (100 mg total) by mouth daily., Disp: 30 capsule, Rfl: 0   gabapentin (NEURONTIN) 100 MG capsule, Take 1 capsule (100 mg total) by mouth 3 (three) times daily., Disp: 90 capsule, Rfl: 0   ibuprofen (ADVIL,MOTRIN) 200 MG tablet, Take 800 mg by mouth every 8 (eight) hours as needed for moderate pain., Disp: , Rfl:    meloxicam (MOBIC) 15 MG tablet, Take 1 tablet (15 mg total) by mouth daily., Disp: 30 tablet, Rfl: 0   omeprazole (PRILOSEC) 20 MG capsule, Take 1 capsule (20 mg total) by mouth daily., Disp: 30 capsule, Rfl: 3   triamcinolone cream (KENALOG) 0.1 %, Apply 1 application topically 2 (two) times daily. For  2 weeks, then as needed (Patient taking differently: Apply 1 application  topically 2 (two) times daily as needed (dry skin).), Disp: 80 g, Rfl: 1   Objective:     There were no vitals filed for this visit.    There is no height or weight on file to calculate BMI.    Physical Exam:    ***   Electronically signed by:  Jenna Ayala [facility] 1:58 PM 05/26/22

## 2022-05-27 ENCOUNTER — Ambulatory Visit (INDEPENDENT_AMBULATORY_CARE_PROVIDER_SITE_OTHER): Payer: Managed Care, Other (non HMO) | Admitting: Sports Medicine

## 2022-05-27 ENCOUNTER — Ambulatory Visit (INDEPENDENT_AMBULATORY_CARE_PROVIDER_SITE_OTHER): Payer: Managed Care, Other (non HMO)

## 2022-05-27 VITALS — BP 126/80 | HR 80 | Ht 67.0 in | Wt 292.0 lb

## 2022-05-27 DIAGNOSIS — M5020 Other cervical disc displacement, unspecified cervical region: Secondary | ICD-10-CM | POA: Diagnosis not present

## 2022-05-27 DIAGNOSIS — M5030 Other cervical disc degeneration, unspecified cervical region: Secondary | ICD-10-CM | POA: Diagnosis not present

## 2022-05-27 DIAGNOSIS — M546 Pain in thoracic spine: Secondary | ICD-10-CM | POA: Diagnosis not present

## 2022-05-27 DIAGNOSIS — M542 Cervicalgia: Secondary | ICD-10-CM

## 2022-05-27 DIAGNOSIS — Z981 Arthrodesis status: Secondary | ICD-10-CM | POA: Diagnosis not present

## 2022-05-27 DIAGNOSIS — G8929 Other chronic pain: Secondary | ICD-10-CM

## 2022-05-27 DIAGNOSIS — M545 Low back pain, unspecified: Secondary | ICD-10-CM

## 2022-05-27 NOTE — Patient Instructions (Addendum)
Good to see you Epidural referral c5-c6 on left  Thoracic and lumbar HEP  2 week follow up after your injection to discuss results

## 2022-06-03 ENCOUNTER — Ambulatory Visit
Admission: RE | Admit: 2022-06-03 | Discharge: 2022-06-03 | Disposition: A | Discharge status: Home / Self Care | Payer: Managed Care, Other (non HMO) | Source: Ambulatory Visit | Attending: Sports Medicine | Admitting: Sports Medicine

## 2022-06-03 DIAGNOSIS — M5030 Other cervical disc degeneration, unspecified cervical region: Secondary | ICD-10-CM

## 2022-06-03 DIAGNOSIS — G8929 Other chronic pain: Secondary | ICD-10-CM

## 2022-06-03 DIAGNOSIS — M542 Cervicalgia: Secondary | ICD-10-CM

## 2022-06-03 DIAGNOSIS — M5020 Other cervical disc displacement, unspecified cervical region: Secondary | ICD-10-CM

## 2022-06-03 DIAGNOSIS — Z981 Arthrodesis status: Secondary | ICD-10-CM

## 2022-06-03 MED ORDER — IOPAMIDOL (ISOVUE-M 300) INJECTION 61%
1.0000 mL | Freq: Once | INTRAMUSCULAR | Status: AC | PRN
  Administered 2022-06-03: 1 mL via EPIDURAL

## 2022-06-03 MED ORDER — TRIAMCINOLONE ACETONIDE 40 MG/ML IJ SUSP (RADIOLOGY)
60.0000 mg | Freq: Once | INTRAMUSCULAR | Status: AC
  Administered 2022-06-03: 60 mg via EPIDURAL

## 2022-06-03 NOTE — Discharge Instructions (Signed)

## 2022-06-07 ENCOUNTER — Other Ambulatory Visit: Payer: Self-pay | Admitting: Family Medicine

## 2022-06-12 NOTE — Progress Notes (Signed)
Nl pap and neg hpv. Repeat 5 years. Gyn office

## 2022-06-16 NOTE — Progress Notes (Signed)
Jenna Ayala D.[name] [address] [place] [address] Phone: [phone]   Assessment and Plan:     1. Neck pain 2. S/P cervical spinal fusion 3. DDD (degenerative disc disease), cervical 4. Cervical disc herniation 5. Chronic midline thoracic back pain  - Chronic with exacerbation, subsequent visit - Significant improvement in neck pain and near resolution of left-sided radicular symptoms after left-sided epidural C5-C6 on 06/06/2022 - Continue HEP for C-spine and add an additional HEP for thoracic spine  Pertinent previous records reviewed include cervical epidural note from 06/03/2022   Follow Up: As needed if no improvement or worsening of symptoms.  Could repeat epidural left-sided C5-C6 if symptoms return as it was significantly beneficial   Subjective:   I, Jenna Ayala, am serving as a Neurosurgeon for Doctor [employer]   Chief Complaint: neck pain that goes to left arm and back pain    HPI:    04/21/22 Patient is a 46 year old female complaining of neck pain that goes to left arm and back pain. Patient states the end of April she had a gallbladder attack , had discomfort on the front and the back , back  discomfort never went away, intermittent neck pain , middle of may neck pain on the left side that radiates down her arm, gallbladder removed last week and still having neck and back pain, has tried hot and cold and that doesn't seem to be removing the problem, tylenol doesn't work, hydrocodone takes the pain away for a little bit but the pain always comes back on the neck and back, when she sits it feels like there is a bulge or something there    05/14/2022 Patient states gabapentin helped it is dulling the pain, not as stiff from the HEP and the meds she states something is not right, now the pain is radiating out , still arm soreness from muscle spasm , neds not lasting the 24 hours more like 6-8 hours     05/27/2022 Patient states that she is doing the same   06/23/2022 Patient states she is doing so much better, still hs soreness but nothing like it was before    Relevant Historical Information: Cholecystectomy 04/16/2022, elevated BMI   Additional pertinent review of systems negative..   Current Outpatient Medications:    acyclovir (ZOVIRAX) 400 MG tablet, Take 1 tablet (400 mg total) by mouth 3 (three) times daily. For 5 days for outbreaks (Patient taking differently: Take 400 mg by mouth 3 (three) times daily as needed (outbreaks). For 5 days for outbreaks), Disp: 45 tablet, Rfl: 2   cyclobenzaprine (FLEXERIL) 10 MG tablet, Take 10 mg by mouth 3 (three) times daily as needed for muscle spasms., Disp: , Rfl:    cyclobenzaprine (FLEXERIL) 10 MG tablet, Take 1 tablet (10 mg total) by mouth at bedtime as needed for muscle spasms., Disp: 15 tablet, Rfl: 0   cyclobenzaprine (FLEXERIL) 5 MG tablet, Take 1 tablet (5 mg total) by mouth 3 (three) times daily as needed for muscle spasms., Disp: 15 tablet, Rfl: 0   diltiazem (CARDIZEM LA) 180 MG 24 hr tablet, Take 1 tablet (180 mg total) by mouth daily., Disp: 90 tablet, Rfl: 3   diphenhydrAMINE (BENADRYL) 25 mg capsule, Take 25 mg by mouth daily as needed for allergies., Disp: , Rfl:    gabapentin (NEURONTIN) 100 MG capsule, Take 1 capsule (100 mg total) by mouth daily., Disp: 30 capsule, Rfl: 0   gabapentin (  NEURONTIN) 100 MG capsule, Take 1 capsule (100 mg total) by mouth 3 (three) times daily., Disp: 90 capsule, Rfl: 0   ibuprofen (ADVIL,MOTRIN) 200 MG tablet, Take 800 mg by mouth every 8 (eight) hours as needed for moderate pain., Disp: , Rfl:    meloxicam (MOBIC) 15 MG tablet, Take 1 tablet (15 mg total) by mouth daily., Disp: 30 tablet, Rfl: 0   omeprazole (PRILOSEC) 20 MG capsule, TAKE ONE CAPSULE BY MOUTH DAILY, Disp: 30 capsule, Rfl: 3   triamcinolone cream (KENALOG) 0.1 %, Apply 1 application topically 2 (two) times daily. For 2 weeks, then as  needed (Patient taking differently: Apply 1 application  topically 2 (two) times daily as needed (dry skin).), Disp: 80 g, Rfl: 1   Objective:     Vitals:   06/23/22 1422  BP: 136/82  Pulse: 87  SpO2: 96%  Weight: 290 lb (131.5 kg)  Height: 5\' 7"  (1.702 m)      Body mass index is 45.42 kg/m.    Physical Exam:    Cervical Spine: Posture normal Skin: normal, intact  Neurological:   Strength:  Right  Left   Deltoid 5/5 5/5  Bicep 5/5  5/5  Tricep 5/5 5/5  Wrist Flexion 5/5 5/5  Wrist Extension 5/5 5/5  Grip 5/5 5/5  Finger Abduction 5/5 5/5   Sensation: intact to light touch in upper extremities bilaterally  Spurling's:  negative bilaterally Neck ROM: Full active ROM TTP: Mild bilateral thoracic paraspinal NTTP: cervical spinous processes, cervical paraspinal,  , trapezius    Electronically signed by:  D.[facility] 2:43 PM 06/23/22

## 2022-06-23 ENCOUNTER — Ambulatory Visit: Payer: Managed Care, Other (non HMO) | Admitting: Sports Medicine

## 2022-06-23 VITALS — BP 136/82 | HR 87 | Ht 67.0 in | Wt 290.0 lb

## 2022-06-23 DIAGNOSIS — M5030 Other cervical disc degeneration, unspecified cervical region: Secondary | ICD-10-CM | POA: Diagnosis not present

## 2022-06-23 DIAGNOSIS — M5020 Other cervical disc displacement, unspecified cervical region: Secondary | ICD-10-CM

## 2022-06-23 DIAGNOSIS — Z981 Arthrodesis status: Secondary | ICD-10-CM | POA: Diagnosis not present

## 2022-06-23 DIAGNOSIS — M542 Cervicalgia: Secondary | ICD-10-CM

## 2022-06-23 DIAGNOSIS — M546 Pain in thoracic spine: Secondary | ICD-10-CM

## 2022-06-23 DIAGNOSIS — G8929 Other chronic pain: Secondary | ICD-10-CM

## 2022-06-23 NOTE — Patient Instructions (Addendum)
Good to see you  Thoracic HEP As needed follow

## 2022-08-04 ENCOUNTER — Encounter: Payer: Self-pay | Admitting: *Deleted

## 2022-09-23 ENCOUNTER — Ambulatory Visit
Admission: RE | Admit: 2022-09-23 | Discharge: 2022-09-23 | Disposition: A | Discharge status: Home / Self Care | Payer: Managed Care, Other (non HMO) | Source: Ambulatory Visit | Attending: Family Medicine | Admitting: Family Medicine

## 2022-09-23 DIAGNOSIS — N6002 Solitary cyst of left breast: Secondary | ICD-10-CM

## 2022-10-06 ENCOUNTER — Encounter: Payer: Managed Care, Other (non HMO) | Admitting: Family Medicine

## 2022-10-23 ENCOUNTER — Encounter: Payer: Self-pay | Admitting: *Deleted

## 2023-07-30 IMAGING — DX DG CERVICAL SPINE 2 OR 3 VIEWS
3 series · 3 of 3 positions shown · non-contrast
Comparison: Cervical MRI 07/03/2010 cervical spine images [id number].

CLINICAL DATA: Neck pain.

EXAM:
CERVICAL SPINE - 2-3 VIEW

[c-spine lat]
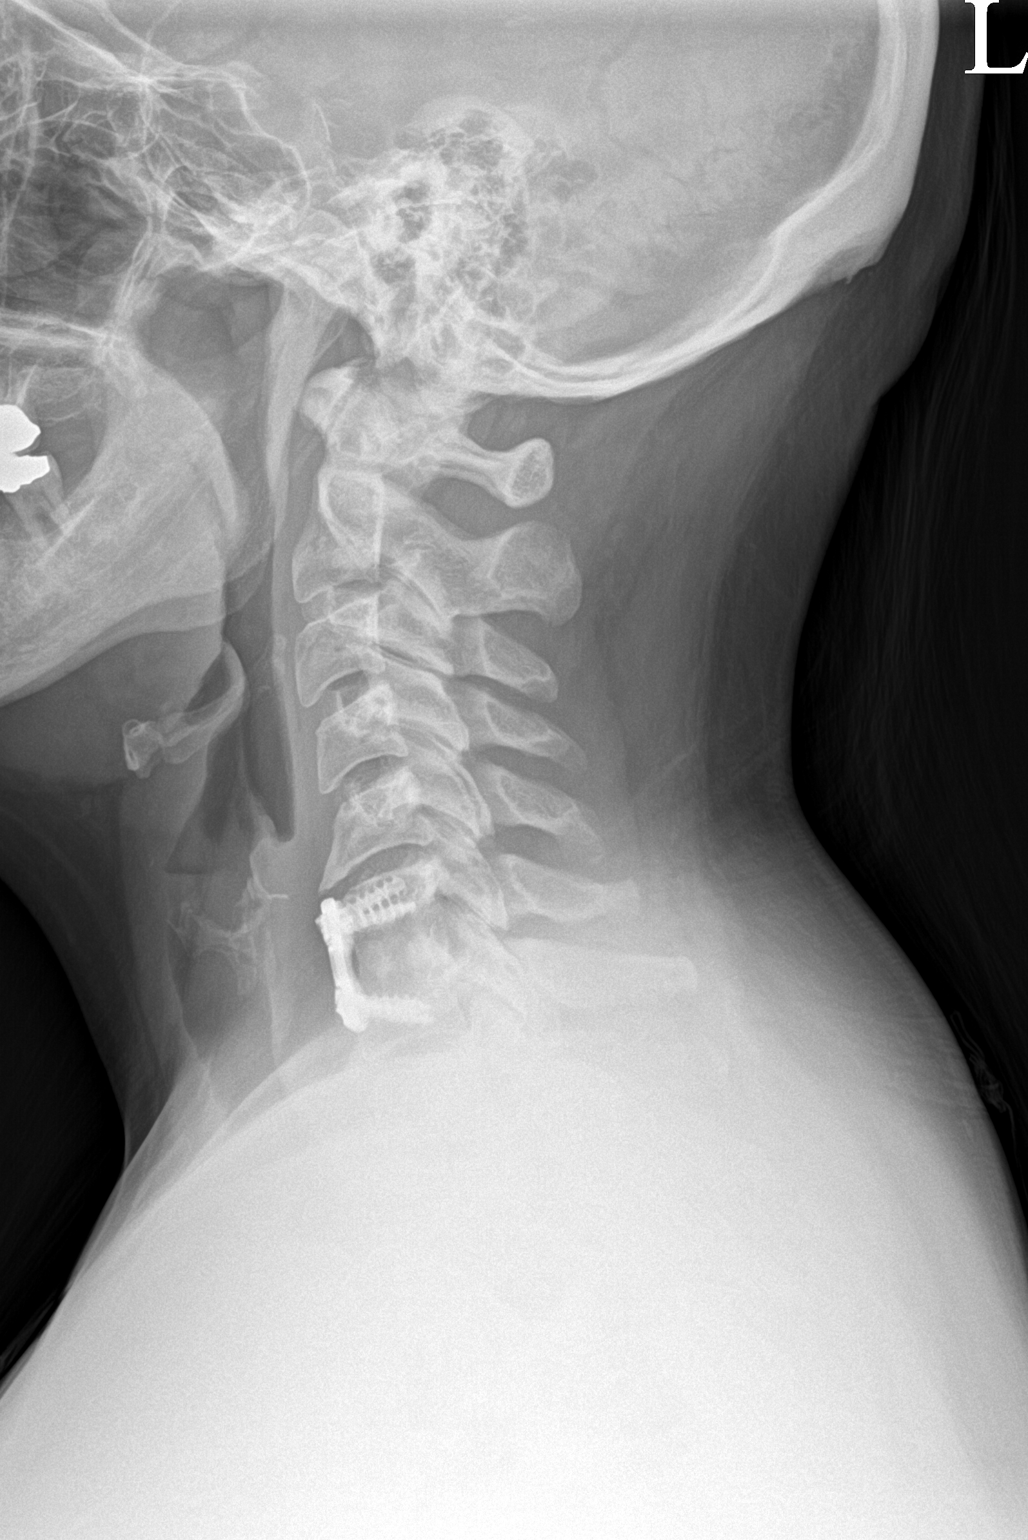

[c-spine ap]
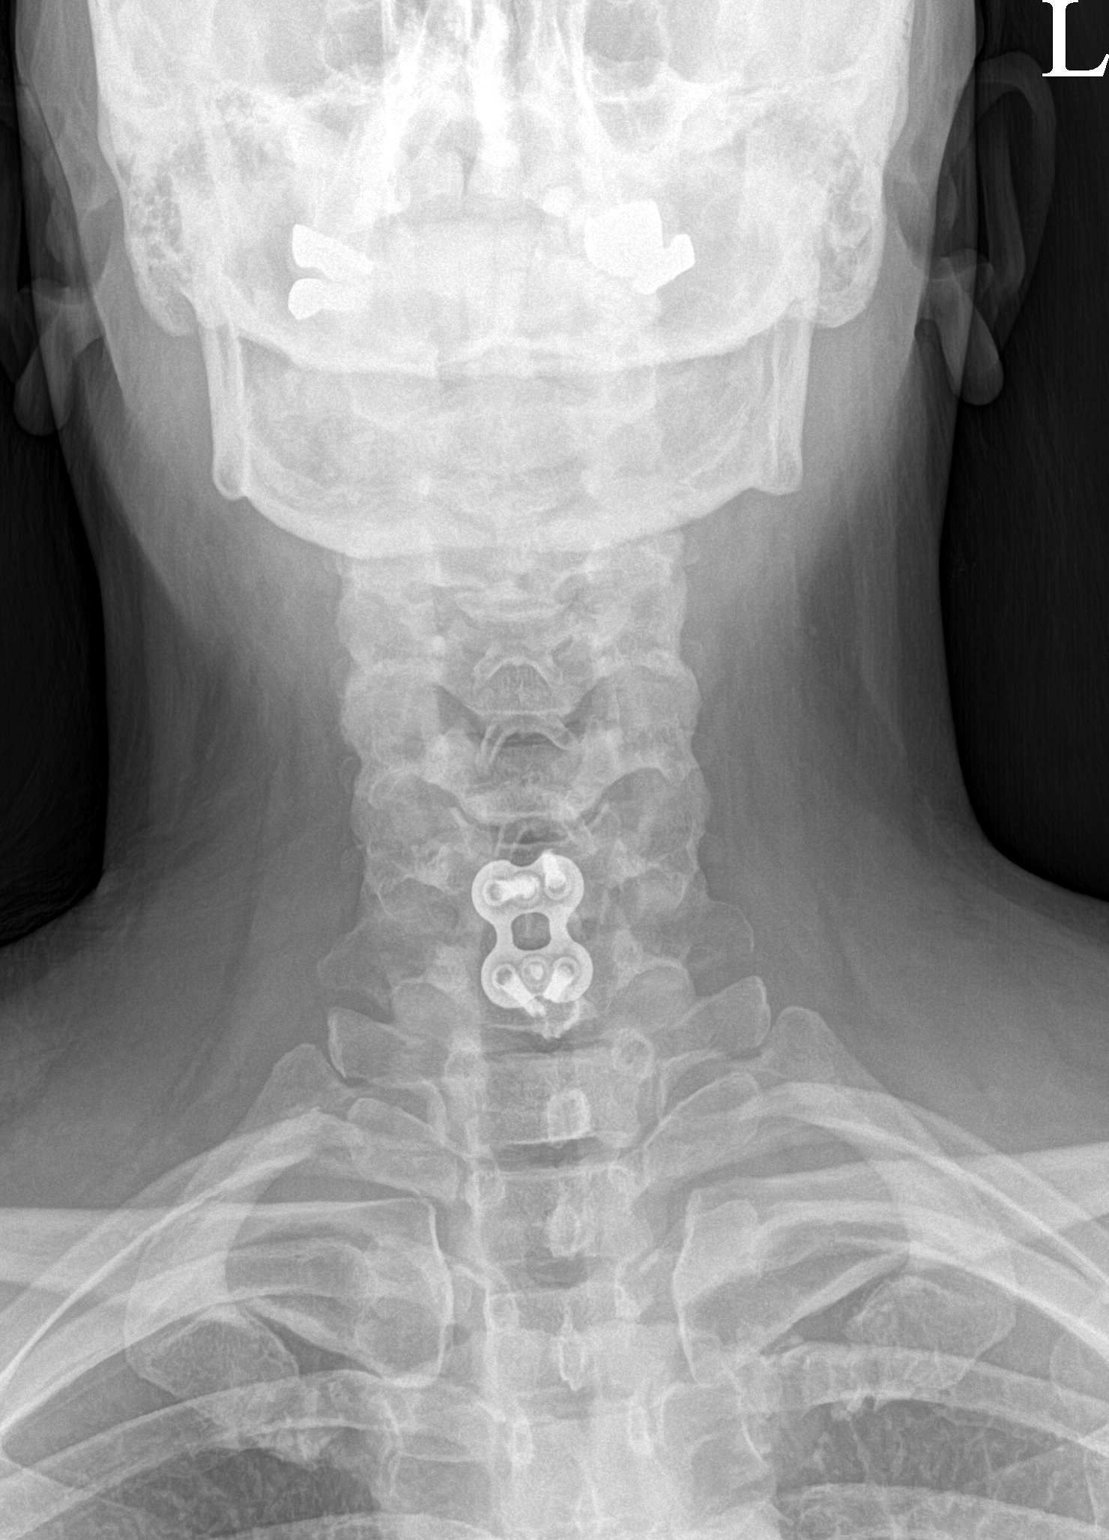

[c-spine open mouth]
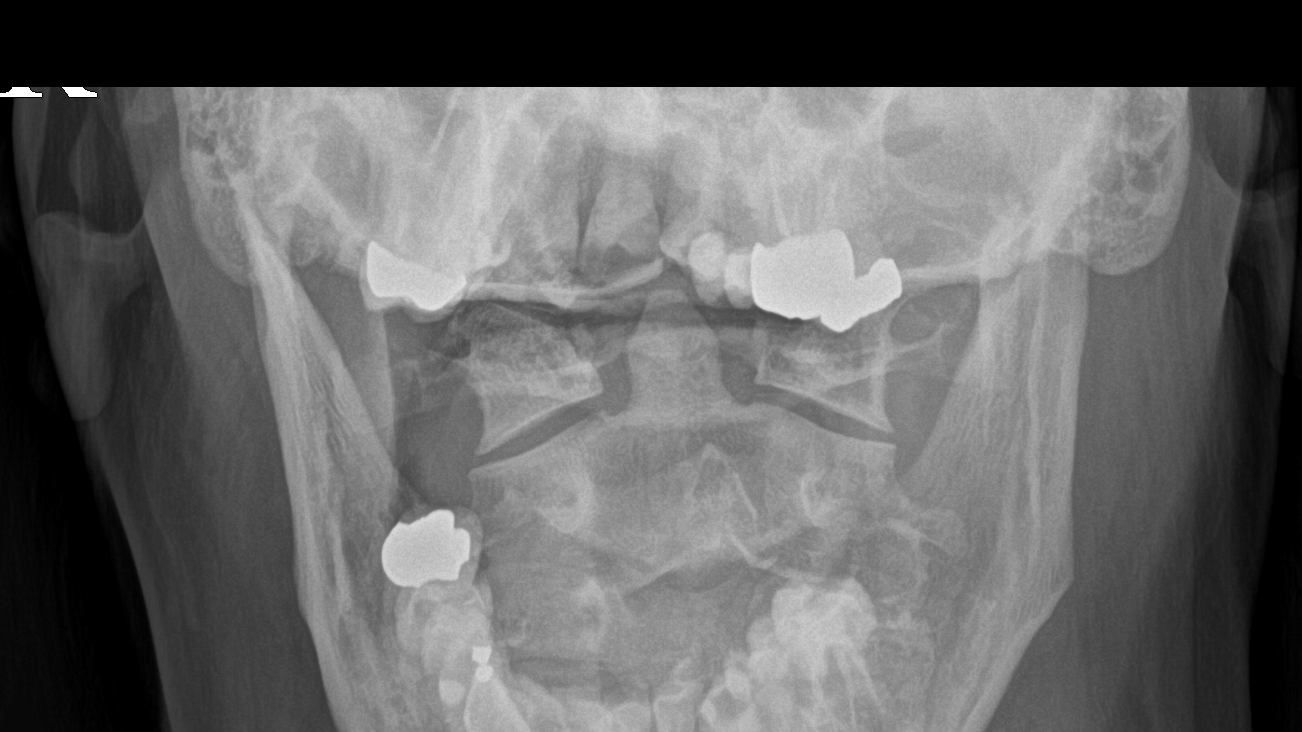

[3 of 3 positions shown; findings below may reference images not displayed]

FINDINGS: C6-C7 anterior fusion. Hardware intact. Anatomic alignment. C5-C6
and C7-T1 disc degeneration endplate osteophyte formation. No
evidence of fracture dislocation. Apical pleural thickening
consistent scarring again noted.
IMPRESSION: 1.  C6-C7 anterior fusion.  Hardware intact.  Anatomic alignment.

2.  C5-C6 and C7-T1 degenerative change.  No acute bony abnormality.
